# Patient Record
Sex: Male | Born: 1960
Health system: Southern US, Community
[De-identification: ages and names within clinical notes are randomized; demographics above are authoritative.]

## PROBLEM LIST (undated history)

## (undated) DIAGNOSIS — M5136 Other intervertebral disc degeneration, lumbar region: Secondary | ICD-10-CM

## (undated) DIAGNOSIS — M51369 Other intervertebral disc degeneration, lumbar region without mention of lumbar back pain or lower extremity pain: Secondary | ICD-10-CM

## (undated) DIAGNOSIS — E119 Type 2 diabetes mellitus without complications: Secondary | ICD-10-CM

## (undated) DIAGNOSIS — F419 Anxiety disorder, unspecified: Secondary | ICD-10-CM

## (undated) DIAGNOSIS — I2510 Atherosclerotic heart disease of native coronary artery without angina pectoris: Secondary | ICD-10-CM

## (undated) DIAGNOSIS — M549 Dorsalgia, unspecified: Secondary | ICD-10-CM

## (undated) DIAGNOSIS — E118 Type 2 diabetes mellitus with unspecified complications: Secondary | ICD-10-CM

## (undated) DIAGNOSIS — I213 ST elevation (STEMI) myocardial infarction of unspecified site: Secondary | ICD-10-CM

## (undated) DIAGNOSIS — N200 Calculus of kidney: Secondary | ICD-10-CM

## (undated) DIAGNOSIS — R7303 Prediabetes: Secondary | ICD-10-CM

## (undated) DIAGNOSIS — E7800 Pure hypercholesterolemia, unspecified: Secondary | ICD-10-CM

## (undated) DIAGNOSIS — E785 Hyperlipidemia, unspecified: Secondary | ICD-10-CM

## (undated) DIAGNOSIS — I10 Essential (primary) hypertension: Secondary | ICD-10-CM

## (undated) DIAGNOSIS — Z720 Tobacco use: Secondary | ICD-10-CM

## (undated) DIAGNOSIS — I252 Old myocardial infarction: Secondary | ICD-10-CM

## (undated) DIAGNOSIS — M109 Gout, unspecified: Secondary | ICD-10-CM

## (undated) DIAGNOSIS — M7989 Other specified soft tissue disorders: Secondary | ICD-10-CM

## (undated) HISTORY — DX: Other intervertebral disc degeneration, lumbar region without mention of lumbar back pain or lower extremity pain: M51.369

## (undated) HISTORY — PX: APPENDECTOMY: SHX54

## (undated) HISTORY — DX: Prediabetes: R73.03

## (undated) HISTORY — DX: Dorsalgia, unspecified: M54.9

## (undated) HISTORY — DX: Old myocardial infarction: I25.2

## (undated) HISTORY — DX: Other specified soft tissue disorders: M79.89

## (undated) HISTORY — DX: Other intervertebral disc degeneration, lumbar region: M51.36

## (undated) HISTORY — DX: Anxiety disorder, unspecified: F41.9

## (undated) HISTORY — DX: Calculus of kidney: N20.0

## (undated) HISTORY — DX: Tobacco use: Z72.0

## (undated) HISTORY — DX: Atherosclerotic heart disease of native coronary artery without angina pectoris: I25.10

---
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
## 2001-06-19 ENCOUNTER — Encounter: Payer: Self-pay | Admitting: *Deleted

## 2001-06-19 ENCOUNTER — Emergency Department (HOSPITAL_COMMUNITY): Admission: EM | Admit: 2001-06-19 | Discharge: 2001-06-19 | Payer: Self-pay | Admitting: *Deleted

## 2001-06-20 ENCOUNTER — Encounter: Payer: Self-pay | Admitting: *Deleted

## 2001-06-20 ENCOUNTER — Ambulatory Visit (HOSPITAL_COMMUNITY): Admission: RE | Admit: 2001-06-20 | Discharge: 2001-06-20 | Payer: Self-pay | Admitting: *Deleted

## 2004-02-13 ENCOUNTER — Emergency Department (HOSPITAL_COMMUNITY): Admission: EM | Admit: 2004-02-13 | Discharge: 2004-02-13 | Payer: Self-pay | Admitting: *Deleted

## 2007-06-05 ENCOUNTER — Emergency Department (HOSPITAL_COMMUNITY): Admission: EM | Admit: 2007-06-05 | Discharge: 2007-06-05 | Payer: Self-pay | Admitting: Family Medicine

## 2007-12-24 ENCOUNTER — Emergency Department (HOSPITAL_COMMUNITY): Admission: EM | Admit: 2007-12-24 | Discharge: 2007-12-24 | Payer: Self-pay | Admitting: Family Medicine

## 2007-12-26 ENCOUNTER — Emergency Department (HOSPITAL_COMMUNITY): Admission: EM | Admit: 2007-12-26 | Discharge: 2007-12-26 | Payer: Self-pay | Admitting: Emergency Medicine

## 2009-05-21 ENCOUNTER — Encounter: Admission: RE | Admit: 2009-05-21 | Discharge: 2009-05-21 | Payer: Self-pay | Admitting: Family Medicine

## 2010-05-13 ENCOUNTER — Ambulatory Visit (HOSPITAL_COMMUNITY): Admission: RE | Admit: 2010-05-13 | Discharge: 2010-05-13 | Payer: Self-pay | Admitting: Neurosurgery

## 2010-05-14 ENCOUNTER — Emergency Department (HOSPITAL_COMMUNITY): Admission: EM | Admit: 2010-05-14 | Discharge: 2010-05-14 | Payer: Self-pay | Admitting: Family Medicine

## 2011-01-21 ENCOUNTER — Emergency Department (HOSPITAL_COMMUNITY): Payer: 59

## 2011-01-21 ENCOUNTER — Emergency Department (HOSPITAL_COMMUNITY)
Admission: EM | Admit: 2011-01-21 | Discharge: 2011-01-21 | Disposition: A | Discharge status: Home / Self Care | Payer: 59 | Attending: Emergency Medicine | Admitting: Emergency Medicine

## 2011-01-21 DIAGNOSIS — R110 Nausea: Secondary | ICD-10-CM | POA: Insufficient documentation

## 2011-01-21 DIAGNOSIS — N200 Calculus of kidney: Secondary | ICD-10-CM | POA: Insufficient documentation

## 2011-01-21 DIAGNOSIS — I10 Essential (primary) hypertension: Secondary | ICD-10-CM | POA: Insufficient documentation

## 2011-01-21 DIAGNOSIS — R1032 Left lower quadrant pain: Secondary | ICD-10-CM | POA: Insufficient documentation

## 2011-01-21 LAB — CBC
Hemoglobin: 15 g/dL (ref 13.0–17.0)
MCH: 27.4 pg (ref 26.0–34.0)
MCV: 82.4 fL (ref 78.0–100.0)
RBC: 5.47 MIL/uL (ref 4.22–5.81)

## 2011-01-21 LAB — COMPREHENSIVE METABOLIC PANEL
ALT: 40 U/L (ref 0–53)
CO2: 29 mEq/L (ref 19–32)
Calcium: 10.4 mg/dL (ref 8.4–10.5)
GFR calc Af Amer: >60 mL/min (ref >60)
GFR calc non Af Amer: >60 mL/min (ref >60)
Glucose, Bld: 187 mg/dL — ABNORMAL HIGH (ref 70–99)
Sodium: 136 mEq/L (ref 135–145)
Total Bilirubin: 0.1 mg/dL — ABNORMAL LOW (ref 0.3–1.2)

## 2011-01-21 LAB — DIFFERENTIAL
Eosinophils Relative: 2 % (ref 0–5)
Lymphs Abs: 2 10*3/uL (ref 0.7–4.0)
Monocytes Relative: 7 % (ref 3–12)
Neutro Abs: 8.5 10*3/uL — ABNORMAL HIGH (ref 1.7–7.7)

## 2011-01-21 LAB — URINE MICROSCOPIC-ADD ON

## 2011-01-21 LAB — URINALYSIS, ROUTINE W REFLEX MICROSCOPIC
Bilirubin Urine: NEGATIVE
Ketones, ur: NEGATIVE mg/dL
Protein, ur: NEGATIVE mg/dL
Urobilinogen, UA: 0.2 mg/dL (ref 0.0–1.0)

## 2011-01-21 MED ORDER — IOHEXOL 300 MG/ML  SOLN
100.0000 mL | Freq: Once | INTRAMUSCULAR | Status: AC | PRN
  Administered 2011-01-21: 100 mL via INTRAVENOUS

## 2011-04-22 ENCOUNTER — Inpatient Hospital Stay (INDEPENDENT_AMBULATORY_CARE_PROVIDER_SITE_OTHER)
Admission: RE | Admit: 2011-04-22 | Discharge: 2011-04-22 | Disposition: A | Discharge status: Home / Self Care | Payer: 59 | Source: Ambulatory Visit | Attending: Family Medicine | Admitting: Family Medicine

## 2011-04-22 ENCOUNTER — Ambulatory Visit (INDEPENDENT_AMBULATORY_CARE_PROVIDER_SITE_OTHER): Payer: 59

## 2011-04-22 DIAGNOSIS — M109 Gout, unspecified: Secondary | ICD-10-CM

## 2011-04-22 LAB — URIC ACID: Uric Acid, Serum: 7.7 mg/dL (ref 4.0–7.8)

## 2014-01-30 ENCOUNTER — Encounter: Payer: Self-pay | Admitting: Internal Medicine

## 2014-04-08 ENCOUNTER — Encounter: Payer: 59 | Admitting: Internal Medicine

## 2014-07-16 ENCOUNTER — Emergency Department (HOSPITAL_COMMUNITY): Payer: 59

## 2014-07-16 ENCOUNTER — Observation Stay (HOSPITAL_COMMUNITY)
Admission: EM | Admit: 2014-07-16 | Discharge: 2014-07-17 | Disposition: A | Discharge status: Home / Self Care | Payer: 59 | Attending: Internal Medicine | Admitting: Internal Medicine

## 2014-07-16 ENCOUNTER — Encounter (HOSPITAL_COMMUNITY): Payer: Self-pay

## 2014-07-16 DIAGNOSIS — Z9049 Acquired absence of other specified parts of digestive tract: Secondary | ICD-10-CM | POA: Insufficient documentation

## 2014-07-16 DIAGNOSIS — R0602 Shortness of breath: Secondary | ICD-10-CM

## 2014-07-16 DIAGNOSIS — M79662 Pain in left lower leg: Secondary | ICD-10-CM

## 2014-07-16 DIAGNOSIS — E780 Pure hypercholesterolemia: Secondary | ICD-10-CM | POA: Diagnosis not present

## 2014-07-16 DIAGNOSIS — I10 Essential (primary) hypertension: Secondary | ICD-10-CM | POA: Diagnosis not present

## 2014-07-16 DIAGNOSIS — M7989 Other specified soft tissue disorders: Secondary | ICD-10-CM | POA: Insufficient documentation

## 2014-07-16 DIAGNOSIS — R9389 Abnormal findings on diagnostic imaging of other specified body structures: Secondary | ICD-10-CM | POA: Diagnosis present

## 2014-07-16 DIAGNOSIS — E1159 Type 2 diabetes mellitus with other circulatory complications: Secondary | ICD-10-CM | POA: Diagnosis present

## 2014-07-16 DIAGNOSIS — Z79899 Other long term (current) drug therapy: Secondary | ICD-10-CM | POA: Diagnosis not present

## 2014-07-16 DIAGNOSIS — R42 Dizziness and giddiness: Secondary | ICD-10-CM | POA: Insufficient documentation

## 2014-07-16 DIAGNOSIS — I152 Hypertension secondary to endocrine disorders: Secondary | ICD-10-CM | POA: Diagnosis present

## 2014-07-16 DIAGNOSIS — R079 Chest pain, unspecified: Principal | ICD-10-CM | POA: Diagnosis present

## 2014-07-16 DIAGNOSIS — Z720 Tobacco use: Secondary | ICD-10-CM | POA: Diagnosis not present

## 2014-07-16 DIAGNOSIS — R072 Precordial pain: Secondary | ICD-10-CM

## 2014-07-16 DIAGNOSIS — M109 Gout, unspecified: Secondary | ICD-10-CM | POA: Diagnosis not present

## 2014-07-16 DIAGNOSIS — R109 Unspecified abdominal pain: Secondary | ICD-10-CM | POA: Insufficient documentation

## 2014-07-16 DIAGNOSIS — F411 Generalized anxiety disorder: Secondary | ICD-10-CM | POA: Diagnosis present

## 2014-07-16 DIAGNOSIS — E785 Hyperlipidemia, unspecified: Secondary | ICD-10-CM | POA: Diagnosis present

## 2014-07-16 DIAGNOSIS — M79602 Pain in left arm: Secondary | ICD-10-CM

## 2014-07-16 HISTORY — DX: Pure hypercholesterolemia, unspecified: E78.00

## 2014-07-16 HISTORY — DX: Essential (primary) hypertension: I10

## 2014-07-16 HISTORY — DX: Gout, unspecified: M10.9

## 2014-07-16 LAB — COMPREHENSIVE METABOLIC PANEL
ALK PHOS: 69 U/L (ref 39–117)
ALT: 34 U/L (ref 0–53)
AST: 41 U/L — ABNORMAL HIGH (ref 0–37)
Albumin: 4 g/dL (ref 3.5–5.2)
Anion gap: 13 (ref 5–15)
BILIRUBIN TOTAL: 1.1 mg/dL (ref 0.3–1.2)
BUN: 14 mg/dL (ref 6–23)
CALCIUM: 9.5 mg/dL (ref 8.4–10.5)
CO2: 20 mmol/L (ref 19–32)
CREATININE: 1 mg/dL (ref 0.50–1.35)
Chloride: 103 mmol/L (ref 96–112)
GFR calc non Af Amer: 85 mL/min — ABNORMAL LOW (ref >90)
Glucose, Bld: 119 mg/dL — ABNORMAL HIGH (ref 70–99)
POTASSIUM: 4.4 mmol/L (ref 3.5–5.1)
SODIUM: 136 mmol/L (ref 135–145)
TOTAL PROTEIN: 6.9 g/dL (ref 6.0–8.3)

## 2014-07-16 LAB — CBC WITH DIFFERENTIAL/PLATELET
BASOS ABS: 0 10*3/uL (ref 0.0–0.1)
Basophils Relative: 0 % (ref 0–1)
EOS PCT: 1 % (ref 0–5)
Eosinophils Absolute: 0.1 10*3/uL (ref 0.0–0.7)
HCT: 44.6 % (ref 39.0–52.0)
HEMOGLOBIN: 14.9 g/dL (ref 13.0–17.0)
LYMPHS ABS: 2.6 10*3/uL (ref 0.7–4.0)
LYMPHS PCT: 31 % (ref 12–46)
MCH: 27 pg (ref 26.0–34.0)
MCHC: 33.4 g/dL (ref 30.0–36.0)
MCV: 80.8 fL (ref 78.0–100.0)
MONOS PCT: 8 % (ref 3–12)
Monocytes Absolute: 0.6 10*3/uL (ref 0.1–1.0)
NEUTROS PCT: 60 % (ref 43–77)
Neutro Abs: 5.1 10*3/uL (ref 1.7–7.7)
Platelets: 170 10*3/uL (ref 150–400)
RBC: 5.52 MIL/uL (ref 4.22–5.81)
RDW: 14.8 % (ref 11.5–15.5)
WBC: 8.4 10*3/uL (ref 4.0–10.5)

## 2014-07-16 LAB — TSH: TSH: 1.858 u[IU]/mL (ref 0.350–4.500)

## 2014-07-16 LAB — LIPASE, BLOOD: LIPASE: 28 U/L (ref 11–59)

## 2014-07-16 LAB — I-STAT TROPONIN, ED: Troponin i, poc: 0 ng/mL (ref 0.00–0.08)

## 2014-07-16 LAB — TROPONIN I: Troponin I: <0.03 ng/mL (ref <0.031)

## 2014-07-16 LAB — D-DIMER, QUANTITATIVE (NOT AT ARMC): D DIMER QUANT: 0.54 ug{FEU}/mL — AB (ref 0.00–0.48)

## 2014-07-16 MED ORDER — NICOTINE 21 MG/24HR TD PT24
21.0000 mg | MEDICATED_PATCH | Freq: Every day | TRANSDERMAL | Status: DC
  Administered 2014-07-16 – 2014-07-17 (×2): 21 mg via TRANSDERMAL
  Filled 2014-07-16 (×2): qty 1

## 2014-07-16 MED ORDER — CLONAZEPAM 1 MG PO TABS: 1.0000 mg | ORAL_TABLET | Freq: Three times a day (TID) | ORAL | Status: DC | PRN

## 2014-07-16 MED ORDER — ACETAMINOPHEN 650 MG RE SUPP: 650.0000 mg | Freq: Four times a day (QID) | RECTAL | Status: DC | PRN

## 2014-07-16 MED ORDER — LISINOPRIL-HYDROCHLOROTHIAZIDE 20-12.5 MG PO TABS: 1.0000 | ORAL_TABLET | Freq: Every day | ORAL | Status: DC

## 2014-07-16 MED ORDER — ENOXAPARIN SODIUM 40 MG/0.4ML ~~LOC~~ SOLN
40.0000 mg | SUBCUTANEOUS | Status: DC
  Administered 2014-07-16: 40 mg via SUBCUTANEOUS
  Filled 2014-07-16: qty 0.4

## 2014-07-16 MED ORDER — HYDROCHLOROTHIAZIDE 12.5 MG PO CAPS
12.5000 mg | ORAL_CAPSULE | Freq: Every day | ORAL | Status: DC
  Administered 2014-07-17: 12.5 mg via ORAL
  Filled 2014-07-16: qty 1

## 2014-07-16 MED ORDER — LISINOPRIL 20 MG PO TABS
20.0000 mg | ORAL_TABLET | Freq: Every day | ORAL | Status: DC
  Administered 2014-07-17: 20 mg via ORAL
  Filled 2014-07-16: qty 1

## 2014-07-16 MED ORDER — MORPHINE SULFATE 2 MG/ML IJ SOLN: 1.0000 mg | INTRAMUSCULAR | Status: DC | PRN

## 2014-07-16 MED ORDER — CLONAZEPAM 0.5 MG PO TABS
0.5000 mg | ORAL_TABLET | Freq: Two times a day (BID) | ORAL | Status: DC | PRN
  Administered 2014-07-16: 1 mg via ORAL
  Filled 2014-07-16: qty 2

## 2014-07-16 MED ORDER — ONDANSETRON HCL 4 MG/2ML IJ SOLN
4.0000 mg | Freq: Once | INTRAMUSCULAR | Status: AC
  Administered 2014-07-16: 4 mg via INTRAVENOUS
  Filled 2014-07-16: qty 2

## 2014-07-16 MED ORDER — ALUM & MAG HYDROXIDE-SIMETH 200-200-20 MG/5ML PO SUSP: 30.0000 mL | Freq: Four times a day (QID) | ORAL | Status: DC | PRN

## 2014-07-16 MED ORDER — SENNOSIDES-DOCUSATE SODIUM 8.6-50 MG PO TABS: 1.0000 | ORAL_TABLET | Freq: Every evening | ORAL | Status: DC | PRN

## 2014-07-16 MED ORDER — ONDANSETRON HCL 4 MG PO TABS: 4.0000 mg | ORAL_TABLET | Freq: Four times a day (QID) | ORAL | Status: DC | PRN

## 2014-07-16 MED ORDER — ONDANSETRON HCL 4 MG/2ML IJ SOLN: 4.0000 mg | Freq: Four times a day (QID) | INTRAMUSCULAR | Status: DC | PRN

## 2014-07-16 MED ORDER — MORPHINE SULFATE 4 MG/ML IJ SOLN
4.0000 mg | Freq: Once | INTRAMUSCULAR | Status: AC
  Administered 2014-07-16: 4 mg via INTRAVENOUS
  Filled 2014-07-16: qty 1

## 2014-07-16 MED ORDER — NITROGLYCERIN 0.4 MG SL SUBL
0.4000 mg | SUBLINGUAL_TABLET | SUBLINGUAL | Status: DC | PRN
  Administered 2014-07-16: 0.4 mg via SUBLINGUAL
  Filled 2014-07-16: qty 1

## 2014-07-16 MED ORDER — IOHEXOL 350 MG/ML SOLN
80.0000 mL | Freq: Once | INTRAVENOUS | Status: AC | PRN
  Administered 2014-07-16: 80 mL via INTRAVENOUS

## 2014-07-16 MED ORDER — ACETAMINOPHEN 325 MG PO TABS: 650.0000 mg | ORAL_TABLET | Freq: Four times a day (QID) | ORAL | Status: DC | PRN

## 2014-07-16 MED ORDER — SODIUM CHLORIDE 0.9 % IJ SOLN
3.0000 mL | Freq: Two times a day (BID) | INTRAMUSCULAR | Status: DC
  Administered 2014-07-16 – 2014-07-17 (×2): 3 mL via INTRAVENOUS

## 2014-07-16 NOTE — ED Notes (Signed)
Pt states the pain is intermittent

## 2014-07-16 NOTE — ED Notes (Signed)
Wife works here as Scientist, clinical (histocompatibility and immunogenetics)[employer] and contacted to make aware her husband is down here.

## 2014-07-16 NOTE — ED Notes (Addendum)
Doppler at the bedside for duplex, wife at the bedside.

## 2014-07-16 NOTE — H&P (Signed)
[facility] History and Physical  Timothy GarinDavid A May ZOX:[id number] DOB: 05/01/1961 DOA: 07/16/2014  Referring physician: EDP PCP: Joycelyn RuaMEYERS, STEPHEN, MD   Chief Complaint: chest pain  HPI: Timothy May is a 54 y.o. male  With h/o tobacco abuse, HTN, HLD, anxiety presents with intermittent left chest pain which radiates to left arm. Today, became more severe, so EMS was summoned. Patient receive aspirin, nitroglycerin which lessened the pain, then morphine which resolved the pain. Has been under stress, and usually feels anxious during these episodes. No cough, f/c. Not associated with exertion, eating. Worsening DOE over the past 6 months. Father had MI in his 4860s.     Review of Systems:  As per HPI. Complete systems review otherwise negative.  Past Medical History  Diagnosis Date  . Hypertension   . Hypercholesteremia    Past Surgical History  Procedure Laterality Date  . Appendectomy     Social History: smokes 1ppd. Drinks rarely. No drugs  No Known Allergies  FH: father with CAD   Prior to Admission medications   Medication Sig Start Date End Date Taking? Authorizing Provider  clonazePAM (KLONOPIN) 0.5 MG tablet Take 0.5-1 mg by mouth daily.   Yes Historical Provider, MD  lisinopril-hydrochlorothiazide (PRINZIDE,ZESTORETIC) 20-12.5 MG per tablet Take 1 tablet by mouth daily.   Yes Historical Provider, MD  metoprolol succinate (TOPROL-XL) 25 MG 24 hr tablet Take 12.5 mg by mouth daily.    Yes Historical Provider, MD   Physical Exam: Filed Vitals:   07/16/14 1515 07/16/14 1600 07/16/14 1645 07/16/14 1722  BP: 136/67 151/95 138/83 147/82  Pulse: 64 68 57   Temp:    97.6 F (36.4 C)  TempSrc:    Oral  Resp: 17 20 13    Height:    6' (1.829 m)  Weight:    109.181 kg (240 lb 11.2 oz)  SpO2: 99% 99% 97% 95%    Wt Readings from Last 3 Encounters:  07/16/14 109.181 kg (240 lb 11.2 oz)  BP 122/68 mmHg  Pulse 58  Temp(Src) 98.1 F (36.7 C) (Oral)  Resp 16  Ht  6' (1.829 m)  Wt 109.181 kg (240 lb 11.2 oz)  BMI 32.64 kg/m2  SpO2 96%  General Appearance:    Alert, cooperative, no distress, appears stated age  Head:    Normocephalic, without obvious abnormality, atraumatic  Eyes:    PERRL, conjunctiva/corneas clear, EOM's intact,    Nose:   Nares normal, septum midline, mucosa normal, no drainage   or sinus tenderness  Throat:   Lips, mucosa, and tongue normal; teeth and gums normal  Neck:   Supple, symmetrical, trachea midline, no adenopathy;       thyroid:  No enlargement/tenderness/nodules; no carotid   bruit or JVD  Back:     Symmetric, no curvature, ROM normal, no CVA tenderness  Lungs:     Clear to auscultation bilaterally, respirations unlabored  Chest wall:    No tenderness or deformity  Heart:    Regular rate and rhythm, S1 and S2 normal, no murmur, rub   or gallop  Abdomen:     Soft, non-tender, bowel sounds active  Genitalia:    deferred  Rectal:    deferred  Extremities:   Extremities normal, atraumatic, no cyanosis or edema  Pulses:   2+ and symmetric all extremities  Skin:   Skin color, texture, turgor normal, no rashes or lesions  Lymph nodes:   Cervical, supraclavicular, and axillary nodes normal  Neurologic:   CNII-XII  intact. Normal strength, sensation and reflexes      throughout    Psych: calm cooperative normal affect        Labs on Admission:  Basic Metabolic Panel:  Recent Labs Lab 07/16/14 1224  NA 136  K 4.4  CL 103  CO2 20  GLUCOSE 119*  BUN 14  CREATININE 1.00  CALCIUM 9.5   Liver Function Tests:  Recent Labs Lab 07/16/14 1224  AST 41*  ALT 34  ALKPHOS 69  BILITOT 1.1  PROT 6.9  ALBUMIN 4.0    Recent Labs Lab 07/16/14 1224  LIPASE 28   No results for input(s): AMMONIA in the last 168 hours. CBC:  Recent Labs Lab 07/16/14 1224  WBC 8.4  NEUTROABS 5.1  HGB 14.9  HCT 44.6  MCV 80.8  PLT 170   Cardiac Enzymes: No results for input(s): CKTOTAL, CKMB, CKMBINDEX, TROPONINI in  the last 168 hours.  BNP (last 3 results) No results for input(s): PROBNP in the last 8760 hours. CBG: No results for input(s): GLUCAP in the last 168 hours.  Radiological Exams on Admission: Ct Angio Chest Pe W/cm &/or Wo Cm  07/16/2014   CLINICAL DATA:  Acute chest pain and shortness of breath.  EXAM: CT ANGIOGRAPHY CHEST WITH CONTRAST  TECHNIQUE: Multidetector CT imaging of the chest was performed using the standard protocol during bolus administration of intravenous contrast. Multiplanar CT image reconstructions and MIPs were obtained to evaluate the vascular anatomy.  CONTRAST:  80mL OMNIPAQUE IOHEXOL 350 MG/ML SOLN  COMPARISON:  Chest radiograph of same day. CT scan of January 21, 2011.  FINDINGS: No pneumothorax or pleural effusion is noted. 6 mm subpleural nodule is noted laterally in the left lower lobe. This is new since prior exam 4 mm nodule is noted adjacent to major fissure laterally in right lower lobe which is unchanged compared to prior exam. 6 mm nodule is noted inferiorly in the right lower lobe best seen on image number 49 of series 2.  There is no evidence of pulmonary embolus. There is no evidence of thoracic aortic dissection or aneurysm. 17 x 14 mm left hilar lymph node is noted. 14 x 13 mm right sub carinal lymph node is noted. Within the visualized portion of the abdomen, porta hepatis adenopathy is noted which is not significantly changed compared to prior CT scan.  Review of the MIP images confirms the above findings.  IMPRESSION: No evidence of pulmonary embolus.  Left hilar and right subcarinal lymph nodes are noted as described above. It is uncertain if these are inflammatory or metastatic in etiology. Bilateral pulmonary nodules are noted, at least 2 of which are not visualized on prior CT scan. The largest of these measures 6 mm. If the patient is at high risk for bronchogenic carcinoma, follow-up chest CT at 6-12 months is recommended. If the patient is at low risk for  bronchogenic carcinoma, follow-up chest CT at 12 months is recommended. This recommendation follows the consensus statement: Guidelines for Management of Small Pulmonary Nodules Detected on CT Scans: A Statement from the Fleischner Society as published in Radiology 2005;237:395-400.   Electronically Signed   By: Roque Lias M.D.   On: 07/16/2014 15:34   Dg Chest Port 1 View  07/16/2014   CLINICAL DATA:  Left-sided chest pain.  EXAM: PORTABLE CHEST - 1 VIEW  COMPARISON:  None.  FINDINGS: There are low lung volumes with crowding of the interstitial markings. There is no focal parenchymal opacity, pleural effusion, or  pneumothorax. The heart and mediastinal contours are unremarkable.  The osseous structures are unremarkable.  IMPRESSION: No active disease.   Electronically Signed   By: Elige Ko   On: 07/16/2014 13:17    [id number] reviewed. NSR  Assessment/Plan  Principal Problem:   Chest pain, atypical, but multiple risk factors and no previous cardiac workup. Continue ASA. Observation, tele, r/o MI. NPO after midnight for stress test. Active Problems:   Generalized anxiety disorder: prn clonazepam   Essential hypertension: continue prinizide. Hold metoprolol for stress test   Hyperlipidemia on statin   Tobacco abuse, counseled against. nicoderm Abnormal CT chest with lymphadenopathy and subcentimeter nodules: repeat CT chest 6-12 months  Ryszard Socarras L [facility]

## 2014-07-16 NOTE — ED Notes (Signed)
Dr. Plunkett at the bedside.  

## 2014-07-16 NOTE — ED Notes (Addendum)
Pain in [facility], today he is having pain left arm chest, and is feeling sob.  Visually distraught.  12 lead unremarkable per EMS. Given 324 asa, 1 ntg, some improvement.  Currently nauseated and dizzy, redness to face and chest.  C/o dizziness.  Wife is anesthesiologist, thinks patient might be having blood clot.

## 2014-07-16 NOTE — ED Provider Notes (Signed)
CSN: [id number]     Arrival date & time 07/16/14  1208 History   First MD Initiated Contact with Patient 07/16/14 1212     Chief Complaint  Patient presents with  . Chest Pain     (Consider location/radiation/quality/duration/timing/severity/associated sxs/prior Treatment) Patient is a 54 y.o. male presenting with chest pain. The history is provided by the patient.  Chest Pain Pain location:  L chest Pain quality: aching and burning   Pain radiates to:  L shoulder Pain radiates to the back: no   Pain severity:  Moderate Onset quality:  Gradual Duration:  2 weeks (this episode started 1 hour ago) Progression:  Unchanged Chronicity:  New Context comment:  Patient states for the last 2 weeks he's had intermittent pain that he cannot relate with eating, exertion or position however in the last 1 week he is also noted swelling and pain in his left lower leg Relieved by: States the pain improves slightly with nitroglycerin given by EMS. Worsened by:  Nothing tried Ineffective treatments:  None tried Associated symptoms: dizziness, lower extremity edema and shortness of breath   Associated symptoms: no abdominal pain, no anorexia, no cough, no diaphoresis, no nausea and not vomiting   Risk factors: high cholesterol, hypertension, male sex and smoking   Risk factors: no diabetes mellitus, no immobilization, no prior DVT/PE and no surgery     Past Medical History  Diagnosis Date  . Hypertension   . Hypercholesteremia    Past Surgical History  Procedure Laterality Date  . Appendectomy     History reviewed. No pertinent family history. History  Substance Use Topics  . Smoking status: Current Every Day Smoker  . Smokeless tobacco: Not on file  . Alcohol Use: Yes    Review of Systems  Constitutional: Negative for diaphoresis.  Respiratory: Positive for shortness of breath. Negative for cough.   Cardiovascular: Positive for chest pain.  Gastrointestinal: Negative for nausea,  vomiting, abdominal pain and anorexia.  Neurological: Positive for dizziness.  All other systems reviewed and are negative.     Allergies  Review of patient's allergies indicates no known allergies.  Home Medications   Prior to Admission medications   Not on File   BP 138/72 mmHg  Pulse 82  Temp(Src) 97.9 F (36.6 C) (Oral)  Resp 19  SpO2 97% Physical Exam  Constitutional: He is oriented to person, place, and time. He appears well-developed and well-nourished. He appears distressed.  Appears uncomfortable  HENT:  Head: Normocephalic and atraumatic.  Mouth/Throat: Oropharynx is clear and moist.  Eyes: Conjunctivae and EOM are normal. Pupils are equal, round, and reactive to light.  Neck: Normal range of motion. Neck supple.  Cardiovascular: Normal rate, regular rhythm and intact distal pulses.   No murmur heard. Pulmonary/Chest: Effort normal and breath sounds normal. No respiratory distress. He has no wheezes. He has no rales.  Abdominal: Soft. He exhibits no distension. There is tenderness. There is no rebound and no guarding.  Musculoskeletal: Normal range of motion. He exhibits tenderness. He exhibits no edema.  Tenderness and nonpitting edema in the left lower extremity. Tenderness is over the medial thigh on the left and the ankle  Neurological: He is alert and oriented to person, place, and time.  Skin: Skin is warm and dry. No rash noted. No erythema.  Psychiatric: He has a normal mood and affect. His behavior is normal.  Nursing note and vitals reviewed.   ED Course  Procedures (including critical care time) Labs Review Labs  Reviewed  COMPREHENSIVE METABOLIC PANEL - Abnormal; Notable for the following:    Glucose, Bld 119 (*)    AST 41 (*)    GFR calc non Af Amer 85 (*)    All other components within normal limits  D-DIMER, QUANTITATIVE - Abnormal; Notable for the following:    D-Dimer, Quant 0.54 (*)    All other components within normal limits  CBC WITH  DIFFERENTIAL/PLATELET  LIPASE, BLOOD  I-STAT TROPOININ, ED    Imaging Review Ct Angio Chest Pe W/cm &/or Wo Cm  07/16/2014   CLINICAL DATA:  Acute chest pain and shortness of breath.  EXAM: CT ANGIOGRAPHY CHEST WITH CONTRAST  TECHNIQUE: Multidetector CT imaging of the chest was performed using the standard protocol during bolus administration of intravenous contrast. Multiplanar CT image reconstructions and MIPs were obtained to evaluate the vascular anatomy.  CONTRAST:  80mL OMNIPAQUE IOHEXOL 350 MG/ML SOLN  COMPARISON:  Chest radiograph of same day. CT scan of January 21, 2011.  FINDINGS: No pneumothorax or pleural effusion is noted. 6 mm subpleural nodule is noted laterally in the left lower lobe. This is new since prior exam 4 mm nodule is noted adjacent to major fissure laterally in right lower lobe which is unchanged compared to prior exam. 6 mm nodule is noted inferiorly in the right lower lobe best seen on image number 49 of series 2.  There is no evidence of pulmonary embolus. There is no evidence of thoracic aortic dissection or aneurysm. 17 x 14 mm left hilar lymph node is noted. 14 x 13 mm right sub carinal lymph node is noted. Within the visualized portion of the abdomen, porta hepatis adenopathy is noted which is not significantly changed compared to prior CT scan.  Review of the MIP images confirms the above findings.  IMPRESSION: No evidence of pulmonary embolus.  Left hilar and right subcarinal lymph nodes are noted as described above. It is uncertain if these are inflammatory or metastatic in etiology. Bilateral pulmonary nodules are noted, at least 2 of which are not visualized on prior CT scan. The largest of these measures 6 mm. If the patient is at high risk for bronchogenic carcinoma, follow-up chest CT at 6-12 months is recommended. If the patient is at low risk for bronchogenic carcinoma, follow-up chest CT at 12 months is recommended. This recommendation follows the consensus  statement: Guidelines for Management of Small Pulmonary Nodules Detected on CT Scans: A Statement from the Fleischner Society as published in Radiology 2005;237:395-400.   Electronically Signed   By: Roque Lias M.D.   On: 07/16/2014 15:34   Dg Chest Port 1 View  07/16/2014   CLINICAL DATA:  Left-sided chest pain.  EXAM: PORTABLE CHEST - 1 VIEW  COMPARISON:  None.  FINDINGS: There are low lung volumes with crowding of the interstitial markings. There is no focal parenchymal opacity, pleural effusion, or pneumothorax. The heart and mediastinal contours are unremarkable.  The osseous structures are unremarkable.  IMPRESSION: No active disease.   Electronically Signed   By: Elige Ko   On: 07/16/2014 13:17     EKG Interpretation   Date/Time:  Tuesday July 16 2014 12:09:33 EST Ventricular Rate:  86 PR Interval:  146 QRS Duration: 86 QT Interval:  384 QTC Calculation: 459 R Axis:   81 Text Interpretation:  Sinus rhythm No previous tracing Confirmed by  Anitra Lauth  MD, Alphonzo Lemmings ([id number]) on 07/16/2014 12:37:56 PM      MDM   Final diagnoses:  Chest  pain    Patient presents with a complaint of left-sided chest pain as well as going into the upper arm that has been intermittent for the last 2 weeks but worse for the last 1 hour. There is associated shortness of breath. Also patient notes that for the last week he's had left lower extremity swelling without PE risk factors such as prolonged immobilization, surgery or trauma.  Patient does have a significant history of hypertension, hyperlipidemia, tobacco abuse as well as a father who had cardiac issues in his 5760s. He has only minimal epigastric tenderness on exam   Patient has no prior cardiac history. EKG today is within normal limits.  Concern for PE versus ACS versus possible GI pathology.  CBC, CMP, lipase, troponin, chest x-ray, lower extremity Doppler pending.  4:06 PM Lower extremity duplex is negative. CT of the chest showed a  pulmonary nodule with lymphadenopathy but no sign of PE given his positive d-dimer. Initial troponin was negative and EKG was normal. However given patient's symptoms of chest pain that's been intermittent for the last 2 weeks and his risk factors feel reasonable that he be admitted for rule out. After nitroglycerin and morphine patient's pain is resolved.  Gwyneth SproutWhitney Mitchelle Sultan, MD 07/16/14 [phone]

## 2014-07-16 NOTE — ED Notes (Signed)
EDMD at bedside

## 2014-07-16 NOTE — ED Notes (Signed)
Portable chest x ray completed.

## 2014-07-17 ENCOUNTER — Observation Stay (HOSPITAL_COMMUNITY): Payer: 59

## 2014-07-17 ENCOUNTER — Encounter (HOSPITAL_COMMUNITY): Payer: Self-pay | Admitting: Cardiology

## 2014-07-17 ENCOUNTER — Other Ambulatory Visit (HOSPITAL_COMMUNITY): Payer: 59

## 2014-07-17 DIAGNOSIS — R0789 Other chest pain: Secondary | ICD-10-CM

## 2014-07-17 DIAGNOSIS — R072 Precordial pain: Secondary | ICD-10-CM

## 2014-07-17 DIAGNOSIS — R938 Abnormal findings on diagnostic imaging of other specified body structures: Secondary | ICD-10-CM

## 2014-07-17 DIAGNOSIS — R079 Chest pain, unspecified: Secondary | ICD-10-CM

## 2014-07-17 DIAGNOSIS — E785 Hyperlipidemia, unspecified: Secondary | ICD-10-CM

## 2014-07-17 DIAGNOSIS — Z720 Tobacco use: Secondary | ICD-10-CM

## 2014-07-17 LAB — LACTATE DEHYDROGENASE: LDH: 151 U/L (ref 94–250)

## 2014-07-17 LAB — INFLUENZA PANEL BY PCR (TYPE A & B)
H1N1FLUPCR: NOT DETECTED
Influenza A By PCR: NEGATIVE
Influenza B By PCR: NEGATIVE

## 2014-07-17 LAB — SEDIMENTATION RATE: SED RATE: 7 mm/h (ref 0–16)

## 2014-07-17 LAB — TROPONIN I: Troponin I: <0.03 ng/mL (ref <0.031)

## 2014-07-17 MED ORDER — TECHNETIUM TC 99M SESTAMIBI GENERIC - CARDIOLITE
30.0000 | Freq: Once | INTRAVENOUS | Status: AC | PRN
  Administered 2014-07-17: 30 via INTRAVENOUS

## 2014-07-17 MED ORDER — ATORVASTATIN CALCIUM 40 MG PO TABS
40.0000 mg | ORAL_TABLET | Freq: Every day | ORAL | Status: DC
  Administered 2014-07-17: 40 mg via ORAL
  Filled 2014-07-17: qty 1

## 2014-07-17 MED ORDER — TECHNETIUM TC 99M SESTAMIBI GENERIC - CARDIOLITE
10.0000 | Freq: Once | INTRAVENOUS | Status: AC | PRN
Start: 2014-07-17 — End: 2014-07-17
  Administered 2014-07-17: 10 via INTRAVENOUS

## 2014-07-17 NOTE — Discharge Summary (Signed)
Physician Discharge Summary  Timothy May BJY:[id number] DOB: 1961-02-12 DOA: 07/16/2014  PCP: Joycelyn Rua, MD  Admit date: 07/16/2014 Discharge date: 07/17/2014  Recommendations for f/u Repeat CT in 3 months f/u nodules and lymphadenopathy  Discharge Diagnoses:  Principal Problem:   Chest pain Active Problems:   Generalized anxiety disorder   Essential hypertension   Hyperlipidemia   Tobacco abuse   Abnormal CT scan, chest  Discharge Condition: stable  Filed Weights   07/16/14 1722 07/17/14 0525  Weight: 109.181 kg (240 lb 11.2 oz) 108.863 kg (240 lb)    History of present illness:  54 y.o. male  With h/o tobacco abuse, HTN, HLD, anxiety presents with intermittent left chest pain which radiates to left arm. Today, became more severe, so EMS was summoned. Patient receive aspirin, nitroglycerin which lessened the pain, then morphine which resolved the pain. Has been under stress, and usually feels anxious during these episodes. No cough, f/c. Not associated with exertion, eating. Worsening DOE over the past 6 months. Father had MI in his 52s.   Hospital Course:  Observed on telemetry. MI ruled out. Cardiology consulted. myoview low risk. Patient reported increase in life stressors. Felt chest pain may be anxiety related. Referred to therapist.  CTA chest showed no PE, but showed subcentemeter nodules and nonspecific lymphadenopathy.  Discussed with oncology. Repeat CT in 3 months.   Procedures:  none  Consultations:  [facility]  Discharge Exam: Filed Vitals:   07/17/14 1302  BP: 129/71  Pulse: 74  Temp: 98.5 F (36.9 C)  Resp:     General: tearful Cardiovascular: RRR Respiratory: CTA   Discharge Instructions   Discharge Instructions    Diet - low sodium heart healthy    Complete by:  As directed      Discharge instructions    Complete by:  As directed   Quit smoking.  Repeat CT chest 3 months     Increase activity slowly    Complete by:  As  directed           Current Discharge Medication List    CONTINUE these medications which have NOT CHANGED   Details  atorvastatin (LIPITOR) 40 MG tablet Take 40 mg by mouth daily.    clonazePAM (KLONOPIN) 0.5 MG tablet Take 0.5-1 mg by mouth 2 (two) times daily as needed for anxiety.     ibuprofen (ADVIL,MOTRIN) 200 MG tablet Take 400-600 mg by mouth every 8 (eight) hours as needed.    lisinopril-hydrochlorothiazide (PRINZIDE,ZESTORETIC) 20-12.5 MG per tablet Take 1 tablet by mouth daily.    metoprolol succinate (TOPROL-XL) 25 MG 24 hr tablet Take 12.5 mg by mouth daily.        No Known Allergies    The results of significant diagnostics from this hospitalization (including imaging, microbiology, ancillary and laboratory) are listed below for reference.    Significant Diagnostic Studies: Ct Angio Chest Pe W/cm &/or Wo Cm  07/16/2014   CLINICAL DATA:  Acute chest pain and shortness of breath.  EXAM: CT ANGIOGRAPHY CHEST WITH CONTRAST  TECHNIQUE: Multidetector CT imaging of the chest was performed using the standard protocol during bolus administration of intravenous contrast. Multiplanar CT image reconstructions and MIPs were obtained to evaluate the vascular anatomy.  CONTRAST:  80mL OMNIPAQUE IOHEXOL 350 MG/ML SOLN  COMPARISON:  Chest radiograph of same day. CT scan of January 21, 2011.  FINDINGS: No pneumothorax or pleural effusion is noted. 6 mm subpleural nodule is noted laterally in the left lower lobe. This  is new since prior exam 4 mm nodule is noted adjacent to major fissure laterally in right lower lobe which is unchanged compared to prior exam. 6 mm nodule is noted inferiorly in the right lower lobe best seen on image number 49 of series 2.  There is no evidence of pulmonary embolus. There is no evidence of thoracic aortic dissection or aneurysm. 17 x 14 mm left hilar lymph node is noted. 14 x 13 mm right sub carinal lymph node is noted. Within the visualized portion of the  abdomen, porta hepatis adenopathy is noted which is not significantly changed compared to prior CT scan.  Review of the MIP images confirms the above findings.  IMPRESSION: No evidence of pulmonary embolus.  Left hilar and right subcarinal lymph nodes are noted as described above. It is uncertain if these are inflammatory or metastatic in etiology. Bilateral pulmonary nodules are noted, at least 2 of which are not visualized on prior CT scan. The largest of these measures 6 mm. If the patient is at high risk for bronchogenic carcinoma, follow-up chest CT at 6-12 months is recommended. If the patient is at low risk for bronchogenic carcinoma, follow-up chest CT at 12 months is recommended. This recommendation follows the consensus statement: Guidelines for Management of Small Pulmonary Nodules Detected on CT Scans: A Statement from the Fleischner Society as published in Radiology 2005;237:395-400.   Electronically Signed   By: Roque Lias M.D.   On: 07/16/2014 15:34   Nm Myocar Multi W/spect W/wall Motion / Ef  07/17/2014   CLINICAL DATA:  54 y.o. male with a h/o tobacco abuse, HTN, HLD and anxiety who presented to ER with complaints of intermittent left chest pain which radiates to left arm  EXAM: MYOCARDIAL IMAGING WITH SPECT (REST AND EXERCISE)  GATED LEFT VENTRICULAR WALL MOTION STUDY  LEFT VENTRICULAR EJECTION FRACTION  TECHNIQUE: Standard myocardial SPECT imaging was performed after resting intravenous injection of 10 mCi Tc-70m sestamibi. Subsequently, exercise tolerance test was performed by the patient under the supervision of the Cardiology staff. At peak-stress, 30 mCi Tc-6m sestamibi was injected intravenously and standard myocardial SPECT imaging was performed. Quantitative gated imaging was also performed to evaluate left ventricular wall motion, and estimate left ventricular ejection fraction.  COMPARISON:  None.  FINDINGS: Perfusion: Mild diaphragmatic attenuation artifact. Otherwise, no  decreased activity in the left ventricle on stress imaging to suggest reversible ischemia or infarction.  Wall Motion: Normal left ventricular wall motion. No left ventricular dilation.  Left Ventricular Ejection Fraction: 70 %  End diastolic volume 85 ml  End systolic volume 25 ml  IMPRESSION: 1. No reversible ischemia or infarction.  2. Normal left ventricular wall motion.  3. Left ventricular ejection fraction %  4. Low-risk stress test findings* with mild diaphragmatic attenuation artifact.  *2012 Appropriate Use Criteria for Coronary Revascularization Focused Update: J Am Coll Cardiol. 2012;59(9):857-881. http://content.dementiazones.com.aspx?articleid=1201161   Electronically Signed   By: Thurmon Fair   On: 07/17/2014 15:16   Dg Chest Port 1 View  07/16/2014   CLINICAL DATA:  Left-sided chest pain.  EXAM: PORTABLE CHEST - 1 VIEW  COMPARISON:  None.  FINDINGS: There are low lung volumes with crowding of the interstitial markings. There is no focal parenchymal opacity, pleural effusion, or pneumothorax. The heart and mediastinal contours are unremarkable.  The osseous structures are unremarkable.  IMPRESSION: No active disease.   Electronically Signed   By: Elige Ko   On: 07/16/2014 13:17   Echo Left ventricle: Systolic  function was normal. The estimated ejection fraction was in the range of 55% to 60%. Wall motion was normal; there were no regional wall motion abnormalities. There was an increased relative contribution of atrial contraction to ventricular filling. Doppler parameters are consistent with abnormal left ventricular relaxation (grade 1 diastolic dysfunction). - Atrial septum: No defect or patent foramen ovale was identified.  EKG NSR  Microbiology: No results found for this or any previous visit (from the past 240 hour(s)).   Labs: Basic Metabolic Panel:  Recent Labs Lab 07/16/14 1224  NA 136  K 4.4  CL 103  CO2 20  GLUCOSE 119*  BUN 14   CREATININE 1.00  CALCIUM 9.5   Liver Function Tests:  Recent Labs Lab 07/16/14 1224  AST 41*  ALT 34  ALKPHOS 69  BILITOT 1.1  PROT 6.9  ALBUMIN 4.0    Recent Labs Lab 07/16/14 1224  LIPASE 28   No results for input(s): AMMONIA in the last 168 hours. CBC:  Recent Labs Lab 07/16/14 1224  WBC 8.4  NEUTROABS 5.1  HGB 14.9  HCT 44.6  MCV 80.8  PLT 170   Cardiac Enzymes:  Recent Labs Lab 07/16/14 2001 07/17/14 0004  TROPONINI <0.03 <0.03   BNP: BNP (last 3 results) No results for input(s): PROBNP in the last 8760 hours. CBG: No results for input(s): GLUCAP in the last 168 hours.     SignedChristiane Ha:  Emme Rosenau L  [facility] 07/17/2014, 5:29 PM

## 2014-07-17 NOTE — Progress Notes (Signed)
Pt discharged home with spouse.  Reviewed discharge instructions and education, all questions answered.  Assessment unchanged from earlier. 

## 2014-07-17 NOTE — Progress Notes (Signed)
UR completed 

## 2014-07-17 NOTE — Consult Note (Addendum)
Admit date: 07/16/2014 Referring Physician  Dr. Lendell Caprice Primary Physician  Dr. Silvestre Moment Primary Cardiologist  None Reason for Consultation  Chest pain  HPI: Timothy May is a 54 y.o. male with a h/o tobacco abuse, HTN, HLD and anxiety who presented to ER with complaints of intermittent left chest pain which radiates to left arm. Yesterday his symptoms were more severe lasting over an hour, so he called EMS. He was given aspirin and nitroglycerin which lessened the pain, then morphine which resolved the pain. He says that he has been under stress, and usually feels anxious during these episodes.  There are no associated symptoms of SOB, diaphoresis or nausea but the pain will radiate down his left arm. He has noticed worsening DOE over the past 6 months. He has been having intermittent episodes of pain for a few weeks now.  They can occur with rest or with exertion.  He has a family history of CAD with her Father having an MI in his 60s. Cardiology is now asked to consult for workup of CP.      PMH:   Past Medical History  Diagnosis Date  . Hypertension   . Hypercholesteremia      PSH:   Past Surgical History  Procedure Laterality Date  . Appendectomy      Allergies:  Review of patient's allergies indicates no known allergies. Prior to Admit Meds:   Prescriptions prior to admission  Medication Sig Dispense Refill Last Dose  . atorvastatin (LIPITOR) 40 MG tablet Take 40 mg by mouth daily.   07/15/2014 at Unknown time  . clonazePAM (KLONOPIN) 0.5 MG tablet Take 0.5-1 mg by mouth 2 (two) times daily as needed for anxiety.    07/15/2014 at Unknown time  . ibuprofen (ADVIL,MOTRIN) 200 MG tablet Take 400-600 mg by mouth every 8 (eight) hours as needed.   07/13/2014  . lisinopril-hydrochlorothiazide (PRINZIDE,ZESTORETIC) 20-12.5 MG per tablet Take 1 tablet by mouth daily.   07/15/2014 at Unknown time  . metoprolol succinate (TOPROL-XL) 25 MG 24 hr tablet Take 12.5 mg by mouth daily.     07/15/2014 at 1200   Fam HX:    Family History  Problem Relation Age of Onset  . Heart attack Father   . Heart disease Father    Social HX:    History   Social History  . Marital Status: Married    Spouse Name: N/A    Number of Children: N/A  . Years of Education: N/A   Occupational History  . Not on file.   Social History Main Topics  . Smoking status: Current Every Day Smoker  . Smokeless tobacco: Not on file  . Alcohol Use: Yes  . Drug Use: Not on file  . Sexual Activity: Not on file   Other Topics Concern  . Not on file   Social History Narrative     ROS:  All 11 ROS were addressed and are negative except what is stated in the HPI  Physical Exam: Blood pressure 135/78, pulse 63, temperature 98.1 F (36.7 C), temperature source Oral, resp. rate 16, height 6' (1.829 m), weight 240 lb (108.863 kg), SpO2 97 %.    General: Well developed, well nourished, in no acute distress Head: Eyes PERRLA, No xanthomas.   Normal cephalic and atramatic  Lungs:   Clear bilaterally to auscultation and percussion. Heart:   HRRR S1 S2 Pulses are 2+ & equal.  No carotid bruit. No JVD.  No abdominal bruits. No femoral bruits. Abdomen: Bowel sounds are positive, abdomen soft and non-tender without masses  Extremities:   No clubbing, cyanosis or edema.  DP +1 Neuro: Alert and oriented X 3. Psych:  Good affect, responds appropriately    Labs:   Lab Results  Component Value Date   WBC 8.4 07/16/2014   HGB 14.9 07/16/2014   HCT 44.6 07/16/2014   MCV 80.8 07/16/2014   PLT 170 07/16/2014     Recent Labs Lab 07/16/14 1224  NA 136  K 4.4  CL 103  CO2 20  BUN 14  CREATININE 1.00  CALCIUM 9.5  PROT 6.9  BILITOT 1.1  ALKPHOS 69  ALT 34  AST 41*  GLUCOSE 119*   No results found for: PTT No results found for: INR, PROTIME Lab Results  Component Value Date   TROPONINI <0.03 07/17/2014    No results found for: CHOL No results found for: HDL No results found  for: LDLCALC No results found for: TRIG No results found for: CHOLHDL No results found for: LDLDIRECT    Radiology:  Ct Angio Chest Pe W/cm &/or Wo Cm  07/16/2014   CLINICAL DATA:  Acute chest pain and shortness of breath.  EXAM: CT ANGIOGRAPHY CHEST WITH CONTRAST  TECHNIQUE: Multidetector CT imaging of the chest was performed using the standard protocol during bolus administration of intravenous contrast. Multiplanar CT image reconstructions and MIPs were obtained to evaluate the vascular anatomy.  CONTRAST:  80mL OMNIPAQUE IOHEXOL 350 MG/ML SOLN  COMPARISON:  Chest radiograph of same day. CT scan of January 21, 2011.  FINDINGS: No pneumothorax or pleural effusion is noted. 6 mm subpleural nodule is noted laterally in the left lower lobe. This is new since prior exam 4 mm nodule is noted adjacent to major fissure laterally in right lower lobe which is unchanged compared to prior exam. 6 mm nodule is noted inferiorly in the right lower lobe best seen on image number 49 of series 2.  There is no evidence of pulmonary embolus. There is no evidence of thoracic aortic dissection or aneurysm. 17 x 14 mm left hilar lymph node is noted. 14 x 13 mm right sub carinal lymph node is noted. Within the visualized portion of the abdomen, porta hepatis adenopathy is noted which is not significantly changed compared to prior CT scan.  Review of the MIP images confirms the above findings.  IMPRESSION: No evidence of pulmonary embolus.  Left hilar and right subcarinal lymph nodes are noted as described above. It is uncertain if these are inflammatory or metastatic in etiology. Bilateral pulmonary nodules are noted, at least 2 of which are not visualized on prior CT scan. The largest of these measures 6 mm. If the patient is at high risk for bronchogenic carcinoma, follow-up chest CT at 6-12 months is recommended. If the patient is at low risk for bronchogenic carcinoma, follow-up chest CT at 12 months is recommended. This  recommendation follows the consensus statement: Guidelines for Management of Small Pulmonary Nodules Detected on CT Scans: A Statement from the Fleischner Society as published in Radiology 2005;237:395-400.   Electronically Signed   By: Roque Lias M.D.   On: 07/16/2014 15:34   Dg Chest Port 1 View  07/16/2014   CLINICAL DATA:  Left-sided chest pain.  EXAM: PORTABLE CHEST - 1 VIEW  COMPARISON:  None.  FINDINGS: There are low lung volumes with crowding of the interstitial markings. There is no focal  parenchymal opacity, pleural effusion, or pneumothorax. The heart and mediastinal contours are unremarkable.  The osseous structures are unremarkable.  IMPRESSION: No active disease.   Electronically Signed   By: Elige KoHetal  Patel   On: 07/16/2014 13:17    EKG:  NSR with no ST changes  ASSESSMENT/PLAN:  1.  Chest pain that is worrisome for coronary ischemia by description but there are no ischemic changes on EKG and normal cardiac enzymes despite an hour of constant CP.  His D-Dimer is mildly elevated with Chest CT angio with no PE.  Will get a stress myoview to rule out ischemia.  Check 2D echo to assess LVF given SOB. 2.  HTN - controlled.  Continue ACE I/HCTZ.  BB on hold due to bradycardia 3.  Dyslipidemia - restart statin home dose 4.  Ongoing tobacco abuse - needs smoking cessation counseling.  Quintella ReichertURNER,TRACI R, MD  07/17/2014  9:15 AM

## 2014-07-17 NOTE — Progress Notes (Signed)
Influenza PCR negative 

## 2014-07-17 NOTE — Progress Notes (Signed)
  Echocardiogram 2D Echocardiogram has been performed.  Leta JunglingCooper, [name] M 07/17/2014, 2:35 PM

## 2014-07-23 ENCOUNTER — Telehealth: Payer: Self-pay | Admitting: Cardiology

## 2014-07-23 NOTE — Telephone Encounter (Signed)
New Message  Pt wife called states that she spoke with Dr. Johney FrameAllred personally and was informed that it is ok to schedule an OV//new pt appt for this pt with himself. Please confirm that this is Ok to schedule

## 2014-07-25 ENCOUNTER — Ambulatory Visit (INDEPENDENT_AMBULATORY_CARE_PROVIDER_SITE_OTHER): Payer: 59 | Admitting: Internal Medicine

## 2014-07-25 ENCOUNTER — Encounter: Payer: Self-pay | Admitting: Internal Medicine

## 2014-07-25 VITALS — BP 118/72 | HR 66 | Ht 72.0 in | Wt 244.0 lb

## 2014-07-25 DIAGNOSIS — R079 Chest pain, unspecified: Secondary | ICD-10-CM

## 2014-07-25 DIAGNOSIS — F329 Major depressive disorder, single episode, unspecified: Secondary | ICD-10-CM

## 2014-07-25 DIAGNOSIS — R0602 Shortness of breath: Secondary | ICD-10-CM

## 2014-07-25 DIAGNOSIS — I10 Essential (primary) hypertension: Secondary | ICD-10-CM

## 2014-07-25 DIAGNOSIS — R42 Dizziness and giddiness: Secondary | ICD-10-CM | POA: Insufficient documentation

## 2014-07-25 DIAGNOSIS — F32A Depression, unspecified: Secondary | ICD-10-CM | POA: Insufficient documentation

## 2014-07-25 MED ORDER — SERTRALINE HCL 50 MG PO TABS: 50.0000 mg | ORAL_TABLET | Freq: Every day | ORAL | Status: DC

## 2014-07-25 MED ORDER — PANTOPRAZOLE SODIUM 40 MG PO TBEC: 40.0000 mg | DELAYED_RELEASE_TABLET | Freq: Every day | ORAL | Status: DC

## 2014-07-25 NOTE — Progress Notes (Signed)
Cardiology Office Note   Date:  07/25/2014   ID:  Timothy May, DOB May 23, 1961, MRN [id number] PCP: Dr Izola Price  Chief Complaint  Patient presents with  . Chest Pain     History of Present Illness: Timothy May is a 54 y.o. male who presents today for cardiology evaluation.   He has been struggling for about 6 weeks with multiple medical complaints.  He reports having headaches, nausea, decreased appetite, chest discomfort, and dizziness.  He recently presented to [facility] and had an extensive workup.  He had doppler which was negative for DVT, CXR which was normal, Chest CT which revealed several pulmonary nodules but no PTE or other acute findings, and lab work which I have reviewed.  He also had a myoview which was low risk and a normal echo.  He says that he has occasional chest discomfort which is substernal but also with radiation frequently into his L shoulder.  He does not notice this in association with exertion or with diet.  He denies SOB.  He has an occasional dizziness which he has difficulty describing but denies presyncope, syncope, bleeding, or neurologic sequela.   He feels that his chest discomfort is occasionally worsened with stress.  He reports difficulty with his 35 year old son.  He has been struggling with this social situation for years but says that it has peaked over the past month or so.  He becomes tearful in the office discussing this today.  He says that he is depressed.  He is not eating and is having difficulty sleeping.  He is very clear that he is not having SI/HI.  He saw Dr Lenise Arena last week and says that he was instructed to consider an antidepressant which he declined at that time.  The patient is tolerating medications without difficulties and is otherwise without complaint today.    Past Medical History  Diagnosis Date  . Hypertension   . Hypercholesteremia   . Gout   . DDD (degenerative disc disease), lumbar     followed by Dr Newell Coral  .  Tobacco abuse    Past Surgical History  Procedure Laterality Date  . Appendectomy      20 years ago     Current Outpatient Prescriptions  Medication Sig Dispense Refill  . atorvastatin (LIPITOR) 40 MG tablet Take 40 mg by mouth daily.    . clonazePAM (KLONOPIN) 0.5 MG tablet Take 0.5-1 mg by mouth 2 (two) times daily as needed for anxiety.     Marland Kitchen ibuprofen (ADVIL,MOTRIN) 200 MG tablet Take 400-600 mg by mouth every 8 (eight) hours as needed.    Marland Kitchen lisinopril-hydrochlorothiazide (PRINZIDE,ZESTORETIC) 20-12.5 MG per tablet Take 1 tablet by mouth 2 (two) times daily.     . metoprolol succinate (TOPROL-XL) 25 MG 24 hr tablet Take 12.5 mg by mouth daily.      No current facility-administered medications for this visit.    Allergies:   Review of patient's allergies indicates no known allergies.   Social History:  The patient  reports that he has been smoking Cigarettes.  He has a 20 pack-year smoking history. He has never used smokeless tobacco. He reports that he drinks alcohol. He reports that he does not use illicit drugs.   Family History:  The patient's family history includes Heart attack in his father; Heart disease in his father; Stroke in his father.    ROS:  Please see the history of present illness.   All other systems are  reviewed and negative.    PHYSICAL EXAM: VS:  BP 118/72 mmHg  Pulse 66  Ht 6' (1.829 m)  Wt 244 lb (110.678 kg)  BMI 33.09 kg/m2 , BMI Body mass index is 33.09 kg/(m^2). GEN: overweight, well developed, in no acute distress HEENT: normal Neck: no JVD, carotid bruits, or masses Cardiac: RRR; no murmurs, rubs, or gallops,no edema  Respiratory:  clear to auscultation bilaterally, normal work of breathing GI: soft, nontender, nondistended, + BS MS: no deformity or atrophy Skin: warm and dry  Neuro:  Strength and sensation are intact Psych: depressed mood, tearful affect at times, very pleasant man  EKG:  EKG is ordered today. The ekg ordered today  shows sinus rhythm, normal ekg   Recent Labs: 07/16/2014: ALT 34; BUN 14; Creatinine 1.00; Hemoglobin 14.9; Platelets 170; Potassium 4.4; Sodium 136; TSH 1.858    Lipid Panel  No results found for: CHOL, TRIG, HDL, CHOLHDL, VLDL, LDLCALC, LDLDIRECT   Wt Readings from Last 3 Encounters:  07/25/14 244 lb (110.678 kg)  07/17/14 240 lb (108.863 kg)      Other studies Reviewed: Additional studies/ records that were reviewed today include: multiple epic records are reviewed as per HPI.  I have also spoken with Dr Lenise Arena partner (he is off today) who reviewed his clinical note with me.  We discussed and formulated the plan together.     ASSESSMENT AND PLAN:  1.  Chest pain The patients recent myoview suggests that he is low risk for ischemia. His ekg today is normal.  His echo is also normal.  Chest CT is also reviewed and did not reveal PTE or vascular cause for his symptoms.  I worry that given his nausea and discomfort that he could have gastritis/ early ulcer.  CBC did not reveal anemia. At this point, I think that the best next step is to start him on protonix and follow clinically. If his symptoms worsen or if he does not improve then further CV testing should be considered.  2. Depression The patient has multiple somatic complaints which I believe are due to depression.  He has substantial depression by my exam today.  He denies suicidal/ homicidal ideations and does appear to be safe.  I have spoken with Dr Lenise Arena partner today and we have decided to initiate zoloft  daily x 1 week, then increase to  daily.  The patient has follow-up already scheduled with Dr Lenise Arena 08/08/14.  I will stop Toprol today which could also contribute to these symptoms.  3. Dizziness If symptoms do not stop with above, I will consider stopping hctz on return  4. HTN Stable No change required today  5. Tobacco- cessation is strongly advised He is ready to quit    Current medicines are  reviewed at length with the patient today.   The patient does not have concerns regarding his medicines.  The following changes were made today:  none  Follow-up: He will follow-up with Dr Daphane Shepherd 08/08/14.  I would like to see him again in 4-5 weeks.  If his symptoms are improved then no further CV testing is planned.  If his symptoms are worsened then further testing (either cardiac CT or cath) may be required.  Today, I have spent over 40 minutes with the patient discussing his clinical depression and multiple somatic complaints.  More than 50% of the visit time today was spent on this issue.  I have also spoken at length with his wife by phone regarding the  same.    Signed, Timothy RangeJames Cher Franzoni, MD  07/25/2014 3:18 PM     [facility] [address] [place] [id number] [phone] (office) [phone] (fax)

## 2014-07-25 NOTE — Telephone Encounter (Signed)
He is coming in today at 3pm

## 2014-07-25 NOTE — Telephone Encounter (Signed)
Follow Up  Pt following up on seeing Dr. Johney FrameAllred ASAP. Claims to have spoken with him personally and wants to have his earliest appt. Please call back and discuss.

## 2014-07-25 NOTE — Telephone Encounter (Signed)
Timothy KaufmannMelissa has called patient and left a message on the home phone as the cell number we have is d/c'd to see if he could come today at 3pm

## 2014-07-25 NOTE — Patient Instructions (Addendum)
Your physician recommends that you schedule a follow-up appointment  6 week follow up appointment  Your physician has recommended you make the following change in your medication:  1) Stop Toprol 2) Start Protonix 40mg  daily 3) Start Zoloft 25mg  daily for 1 week then increase to 50mg  daily

## 2014-07-30 ENCOUNTER — Telehealth: Payer: Self-pay | Admitting: *Deleted

## 2014-07-30 NOTE — Telephone Encounter (Signed)
Referring doctors office called to cancel new patient appointment

## 2014-08-02 ENCOUNTER — Encounter: Payer: Self-pay | Admitting: Neurology

## 2014-08-02 ENCOUNTER — Ambulatory Visit (INDEPENDENT_AMBULATORY_CARE_PROVIDER_SITE_OTHER): Payer: 59 | Admitting: Neurology

## 2014-08-02 VITALS — BP 142/81 | HR 63 | Ht 72.0 in | Wt 244.6 lb

## 2014-08-02 DIAGNOSIS — R42 Dizziness and giddiness: Secondary | ICD-10-CM

## 2014-08-02 DIAGNOSIS — F411 Generalized anxiety disorder: Secondary | ICD-10-CM

## 2014-08-02 MED ORDER — TOPIRAMATE 25 MG PO TABS: ORAL_TABLET | ORAL | Status: DC

## 2014-08-02 NOTE — Patient Instructions (Addendum)
Topamax (topiramate) is a seizure medication that has an FDA approval for seizures and for migraine headache. Potential side effects of this medication include weight loss, cognitive slowing, tingling in the fingers and toes, and carbonated drinks will taste bad. If any significant side effects are noted on this drug, please contact our office.  Dizziness Dizziness is a common problem. It is a feeling of unsteadiness or light-headedness. You may feel like you are about to faint. Dizziness can lead to injury if you stumble or fall. A person of any age group can suffer from dizziness, but dizziness is more common in older adults. CAUSES  Dizziness can be caused by many different things, including:  Middle ear problems.  Standing for too long.  Infections.  An allergic reaction.  Aging.  An emotional response to something, such as the sight of blood.  Side effects of medicines.  Tiredness.  Problems with circulation or blood pressure.  Excessive use of alcohol or medicines, or illegal drug use.  Breathing too fast (hyperventilation).  An irregular heart rhythm (arrhythmia).  A low red blood cell count (anemia).  Pregnancy.  Vomiting, diarrhea, fever, or other illnesses that cause body fluid loss (dehydration).  Diseases or conditions such as Parkinson's disease, high blood pressure (hypertension), diabetes, and thyroid problems.  Exposure to extreme heat. DIAGNOSIS  Your health care provider will ask about your symptoms, perform a physical exam, and perform an electrocardiogram (ECG) to record the electrical activity of your heart. Your health care provider may also perform other heart or blood tests to determine the cause of your dizziness. These may include:  Transthoracic echocardiogram (TTE). During echocardiography, sound waves are used to evaluate how blood flows through your heart.  Transesophageal echocardiogram (TEE).  Cardiac monitoring. This allows your health  care provider to monitor your heart rate and rhythm in real time.  Holter monitor. This is a portable device that records your heartbeat and can help diagnose heart arrhythmias. It allows your health care provider to track your heart activity for several days if needed.  Stress tests by exercise or by giving medicine that makes the heart beat faster. TREATMENT  Treatment of dizziness depends on the cause of your symptoms and can vary greatly. HOME CARE INSTRUCTIONS   Drink enough fluids to keep your urine clear or pale yellow. This is especially important in very hot weather. In older adults, it is also important in cold weather.  Take your medicine exactly as directed if your dizziness is caused by medicines. When taking blood pressure medicines, it is especially important to get up slowly.  Rise slowly from chairs and steady yourself until you feel okay.  In the morning, first sit up on the side of the bed. When you feel okay, stand slowly while holding onto something until you know your balance is fine.  Move your legs often if you need to stand in one place for a long time. Tighten and relax your muscles in your legs while standing.  Have someone stay with you for 1-2 days if dizziness continues to be a problem. Do this until you feel you are well enough to stay alone. Have the person call your health care provider if he or she notices changes in you that are concerning.  Do not drive or use heavy machinery if you feel dizzy.  Do not drink alcohol. SEEK IMMEDIATE MEDICAL CARE IF:   Your dizziness or light-headedness gets worse.  You feel nauseous or vomit.  You have problems  talking, walking, or using your arms, hands, or legs.  You feel weak.  You are not thinking clearly or you have trouble forming sentences. It may take a friend or family member to notice this.  You have chest pain, abdominal pain, shortness of breath, or sweating.  Your vision changes.  You notice any  bleeding.  You have side effects from medicine that seems to be getting worse rather than better. MAKE SURE YOU:   Understand these instructions.  Will watch your condition.  Will get help right away if you are not doing well or get worse. Document Released: 12/01/2000 Document Revised: 06/12/2013 Document Reviewed: 12/25/2010 [facility] Patient Information 2015 [employer] [place]. This information is not intended to replace advice given to you by your health care provider. Make sure you discuss any questions you have with your health care provider.

## 2014-08-02 NOTE — Progress Notes (Signed)
Reason for visit: Dizziness  Timothy May is a 54 y.o. male  History of present illness:  Timothy May is a 54 year old right-handed white male who indicates a six-month history of some problems with memory and concentration. He has been somewhat forgetful over time. He has a pre-existing history of anxiety and depression, and he was recently evaluated for noncardiac chest pain. He began having headaches 6-8 weeks ago that are occurring 4 or 5 times a week. He does have a family history of headaches, his sister had severe migraine. The patient indicates that headaches are unusual for him, however. The headaches are in the frontal areas of the head, and are generally fairly responsive to Advil. The headache may last an hour with medication. The patient began having episodes of dizziness that began about one month ago. The episodes may occur on average once a week, lasting about 5 minutes. The patient describes a lightheaded floaty sensation, but there are some sensations of true vertigo as well. He may become staggery with the events. The patient has had some pervasive problems for a number of weeks with nausea. This is a daily occurrence, not clearly associated with the headaches only. He denies any visual disturbances, double vision, slurred speech, or problems with swallowing. He denies any gait disturbance when he is not dizzy. He denies issues controlling the bowels or the bladder. He has had no weakness of the extremities. Over the last 3 weeks, he has developed insomnia. He will get to sleep, but he wakes up at 1 or 2 in the morning, and he cannot get back to sleep. Fatigue has significantly worsened along with the insomnia. He comes to this office for an evaluation.  Past Medical History  Diagnosis Date  . Hypertension   . Hypercholesteremia   . Gout   . DDD (degenerative disc disease), lumbar     followed by Timothy May  . Tobacco abuse   . Renal calculus or stone     Past  Surgical History  Procedure Laterality Date  . Appendectomy      20 years ago    Family History  Problem Relation Age of Onset  . Heart attack Father     father had multiple MIs, first MI age 69s, multiple paternal family members with CAD in their 34s  . Heart disease Father   . Stroke Father   . Cancer Mother   . Cancer Sister     Social history:  reports that he has been smoking Cigarettes.  He has a 20 pack-year smoking history. He has never used smokeless tobacco. He reports that he drinks alcohol. He reports that he does not use illicit drugs.  Medications:  Current Outpatient Prescriptions on File Prior to Visit  Medication Sig Dispense Refill  . atorvastatin (LIPITOR) 40 MG tablet Take 40 mg by mouth daily.    . clonazePAM (KLONOPIN) 0.5 MG tablet Take 0.5-1 mg by mouth 2 (two) times daily as needed for anxiety.     Marland Kitchen ibuprofen (ADVIL,MOTRIN) 200 MG tablet Take 400-600 mg by mouth every 8 (eight) hours as needed.    Marland Kitchen lisinopril-hydrochlorothiazide (PRINZIDE,ZESTORETIC) 20-12.5 MG per tablet Take 1 tablet by mouth 2 (two) times daily.     . pantoprazole (PROTONIX) 40 MG tablet Take 1 tablet (40 mg total) by mouth daily. 30 tablet 11  . sertraline (ZOLOFT) 50 MG tablet Take 1 tablet (50 mg total) by mouth daily. 30 tablet 0   No current facility-administered  medications on file prior to visit.     No Known Allergies  ROS:  Out of a complete 14 system review of symptoms, the patient complains only of the following symptoms, and all other reviewed systems are negative.  Fatigue Chest pain Shortness of breath Feeling hot Memory loss, headache, dizziness, passing out Sleepiness  Blood pressure 142/81, pulse 63, height 6' (1.829 m), weight 244 lb 9.6 oz (110.95 kg).  Physical Exam  General: The patient is alert and cooperative at the time of the examination. The patient is minimally to moderately obese.  Eyes: Pupils are equal, round, and reactive to light. Discs  are flat bilaterally.  Ears: Tympanic membranes are clear bilaterally.  Neck: The neck is supple, no carotid bruits are noted.  Respiratory: The respiratory examination is clear.  Cardiovascular: The cardiovascular examination reveals a regular rate and rhythm, no obvious murmurs or rubs are noted.  Skin: Extremities are without significant edema.  Neurologic Exam  Mental status: The patient is alert and oriented x 3 at the time of the examination. The patient has apparent normal recent and remote memory, with an apparently normal attention span and concentration ability.  Cranial nerves: Facial symmetry is present. There is good sensation of the face to pinprick and soft touch bilaterally. The strength of the facial muscles and the muscles to head turning and shoulder shrug are normal bilaterally. Speech is well enunciated, no aphasia or dysarthria is noted. Extraocular movements are full. Visual fields are full. The tongue is midline, and the patient has symmetric elevation of the soft palate. No obvious hearing deficits are noted.  Motor: The motor testing reveals 5 over 5 strength of all 4 extremities. Good symmetric motor tone is noted throughout.  Sensory: Sensory testing is intact to pinprick, soft touch, vibration sensation, and position sense on all 4 extremities. No evidence of extinction is noted.  Coordination: Cerebellar testing reveals good finger-nose-finger and heel-to-shin bilaterally.  Gait and station: Gait is normal. Tandem gait is normal. Romberg is negative. No drift is seen.  Reflexes: Deep tendon reflexes are symmetric and normal bilaterally. Toes are downgoing bilaterally.   Assessment/Plan:  1. Episodic dizziness  2. Headache  3. Reported memory disorder  4. Insomnia  5. Fatigue  6. History of anxiety and depression  7. Noncardiac chest pain  The patient has had a multitude of symptoms that developed over the last several months. The patient has  developed chronic nausea, dizziness, headache, memory issues, insomnia, fatigue, and noncardiac chest pain. These issues may be a manifestation of anxiety and depression, but further neurologic evaluation is required. He will undergo MRI evaluation of the brain, and he will have blood work done today. He will follow-up in about 3 months. I will place him on Topamax for the headaches to see if this improves the headache and the dizziness.  Marlan Palau. Keith Neno Hohensee MD 08/02/2014 2:42 PM  [facility] [address] [place], [place] [id number]  Phone [phone] Fax [phone]

## 2014-08-03 LAB — SPECIMEN STATUS REPORT

## 2014-08-06 LAB — COPPER, SERUM: Copper: 133 ug/dL (ref 72–166)

## 2014-08-06 LAB — SEDIMENTATION RATE: Sed Rate: 20 mm/hr (ref 0–30)

## 2014-08-06 LAB — RPR: RPR: NONREACTIVE

## 2014-08-06 LAB — VITAMIN B12: Vitamin B-12: 554 pg/mL (ref 211–946)

## 2014-08-12 ENCOUNTER — Ambulatory Visit: Payer: 59 | Admitting: Cardiology

## 2014-08-15 ENCOUNTER — Ambulatory Visit (INDEPENDENT_AMBULATORY_CARE_PROVIDER_SITE_OTHER): Payer: 59

## 2014-08-15 DIAGNOSIS — F411 Generalized anxiety disorder: Secondary | ICD-10-CM

## 2014-08-15 DIAGNOSIS — R42 Dizziness and giddiness: Secondary | ICD-10-CM

## 2014-08-16 ENCOUNTER — Telehealth: Payer: Self-pay | Admitting: Neurology

## 2014-08-16 NOTE — Telephone Encounter (Signed)
I called the patient. MRI the brain shows minimal white matter changes, the patient does have hypertension, has headache that may be migraine. I discussed this with the patient. He is on the Topamax, and he continues to go up on the dose.   MRI brain 08/16/14:  IMPRESSION:  Mildly abnormal MRI brain (without) demonstrating 1. There are several non-specific, subcortical and juxtacortical, round, T2 hyperintensities noted in the bifrontal regions. These findings are non-specific and considerations include autoimmune, inflammatory, post-infectious, microvascular ischemic or migraine associated etiologies. 2. Moderate-large cavum septum pellucidum and cavum vergae noted, which can be normal variants. 3. No acute findings.

## 2014-09-02 ENCOUNTER — Encounter: Payer: Self-pay | Admitting: Internal Medicine

## 2014-09-02 ENCOUNTER — Ambulatory Visit (INDEPENDENT_AMBULATORY_CARE_PROVIDER_SITE_OTHER): Payer: 59 | Admitting: Internal Medicine

## 2014-09-02 VITALS — BP 130/78 | HR 71 | Ht 72.0 in | Wt 241.8 lb

## 2014-09-02 DIAGNOSIS — F411 Generalized anxiety disorder: Secondary | ICD-10-CM

## 2014-09-02 DIAGNOSIS — Z720 Tobacco use: Secondary | ICD-10-CM

## 2014-09-02 DIAGNOSIS — F329 Major depressive disorder, single episode, unspecified: Secondary | ICD-10-CM

## 2014-09-02 DIAGNOSIS — F32A Depression, unspecified: Secondary | ICD-10-CM

## 2014-09-02 DIAGNOSIS — I10 Essential (primary) hypertension: Secondary | ICD-10-CM

## 2014-09-02 DIAGNOSIS — R0789 Other chest pain: Secondary | ICD-10-CM

## 2014-09-02 MED ORDER — SERTRALINE HCL 100 MG PO TABS: 100.0000 mg | ORAL_TABLET | Freq: Every day | ORAL | Status: DC

## 2014-09-02 NOTE — Patient Instructions (Signed)
Your physician recommends that you schedule a follow-up appointment in: 4 months with Dr Johney FrameAllred  Your physician has recommended you make the following change in your medication:  1) Increase Zoloft to 100 mg daily

## 2014-09-03 ENCOUNTER — Encounter: Payer: Self-pay | Admitting: Internal Medicine

## 2014-09-03 NOTE — Progress Notes (Signed)
Cardiology Office Note   Date:  09/03/2014   ID:  COREON SIMKINS, DOB [birth year], MRN [id number] PCP: Dr Izola Price  Chief Complaint  Patient presents with  . Follow-up    Chest pain, SOB & dizziness     History of Present Illness: Timothy May is a 54 y.o. male who presents today for cardiology follow-up.  He is doing much better.  His CP is resolved.  His HA, nausea, and dizziness are also improved.  His depression is better however he continues to have anxiety and difficulty sleeping.  He is trying to quit smoking.  He is also starting to exercise.  The patient is tolerating medications without difficulties and is otherwise without complaint today.    Past Medical History  Diagnosis Date  . Hypertension   . Hypercholesteremia   . Gout   . DDD (degenerative disc disease), lumbar     followed by Dr Newell Coral  . Tobacco abuse   . Renal calculus or stone    Past Surgical History  Procedure Laterality Date  . Appendectomy      20 years ago     Current Outpatient Prescriptions  Medication Sig Dispense Refill  . atorvastatin (LIPITOR) 40 MG tablet Take 40 mg by mouth daily.    . clonazePAM (KLONOPIN) 0.5 MG tablet Take 0.5-1 mg by mouth 2 (two) times daily as needed for anxiety.     Marland Kitchen ibuprofen (ADVIL,MOTRIN) 200 MG tablet Take 400-600 mg by mouth every 8 (eight) hours as needed (pain).     Marland Kitchen lisinopril-hydrochlorothiazide (PRINZIDE,ZESTORETIC) 20-12.5 MG per tablet Take 1 tablet by mouth 2 (two) times daily.     . pantoprazole (PROTONIX) 40 MG tablet Take 1 tablet (40 mg total) by mouth daily. 30 tablet 11  . sertraline (ZOLOFT) 100 MG tablet Take 1 tablet (100 mg total) by mouth daily. 90 tablet 3  . topiramate (TOPAMAX) 25 MG tablet Take 75 mg by mouth at bedtime.     No current facility-administered medications for this visit.    Allergies:   Review of patient's allergies indicates no known allergies.   Social History:  The patient  reports that he has been  smoking Cigarettes.  He has a 20 pack-year smoking history. He has never used smokeless tobacco. He reports that he drinks alcohol. He reports that he does not use illicit drugs.   Family History:  The patient's family history includes Cancer in his mother and sister; Heart attack in his father; Heart disease in his father; Stroke in his father.    ROS:  Please see the history of present illness.   All other systems are reviewed and negative.    PHYSICAL EXAM: VS:  BP 130/78 mmHg  Pulse 71  Ht 6' (1.829 m)  Wt 241 lb 12.8 oz (109.68 kg)  BMI 32.79 kg/m2 , BMI Body mass index is 32.79 kg/(m^2). GEN: overweight, well developed, in no acute distress HEENT: normal Neck: no JVD, carotid bruits, or masses Cardiac: RRR; no murmurs, rubs, or gallops,no edema  Respiratory:  clear to auscultation bilaterally, normal work of breathing GI: soft, nontender, nondistended, + BS MS: no deformity or atrophy Skin: warm and dry  Neuro:  Strength and sensation are intact Psych: anxious mood, full affect, very pleasant man   Recent Labs: 07/16/2014: ALT 34; BUN 14; Creatinine 1.00; Hemoglobin 14.9; Platelets 170; Potassium 4.4; Sodium 136; TSH 1.858    Lipid Panel  No results found for: CHOL, TRIG, HDL, CHOLHDL, VLDL, LDLCALC,  LDLDIRECT   Wt Readings from Last 3 Encounters:  09/02/14 241 lb 12.8 oz (109.68 kg)  08/02/14 244 lb 9.6 oz (110.95 kg)  07/25/14 244 lb (110.678 kg)    ASSESSMENT AND PLAN:  1.  Chest pain Resolved No further workup Consider stopping PPI in the future and then reassess symptoms.  2. Depression Improved but continues to have anxiety and difficulty sleeping.  [place] and this has improved his stress.  3. Dizziness Iimproved, I will consider stopping hctz on return if symptoms worsen  4. HTN Stable No change required today  5. Tobacco- cessation is strongly advised He is ready to quit    Current medicines are reviewed at length  with the patient today.   The patient does not have concerns regarding his medicines.  The following changes were made today:  none  Follow-up:   Return to see me in 4 months    Signed, Hillis RangeJames Lanya Bucks, MD  09/03/2014 11:06 PM     [facility] [address] [place] [address] [phone] (office) [phone] (fax)

## 2014-09-10 ENCOUNTER — Ambulatory Visit: Payer: 59 | Admitting: Neurology

## 2014-10-01 ENCOUNTER — Telehealth: Payer: Self-pay

## 2014-10-01 NOTE — Telephone Encounter (Signed)
Left voicemail asking patient to call back to reschedule follow up appointment from August to an earlier time in May, as that is when Dr. Anne HahnWillis wanted to see him back.

## 2014-10-21 ENCOUNTER — Telehealth: Payer: Self-pay

## 2014-10-21 NOTE — Telephone Encounter (Signed)
Left voicemail that we have an opening tomorrow (5/3) at 3:30. If the patient would like to come in, he will call back to schedule.

## 2015-01-30 ENCOUNTER — Ambulatory Visit: Payer: 59 | Admitting: Neurology

## 2015-01-30 ENCOUNTER — Telehealth: Payer: Self-pay

## 2015-01-30 NOTE — Telephone Encounter (Signed)
Patient did not come to a follow up appointment today.  

## 2015-02-11 ENCOUNTER — Encounter: Payer: Self-pay | Admitting: Neurology

## 2015-06-25 ENCOUNTER — Other Ambulatory Visit: Payer: Self-pay | Admitting: Family Medicine

## 2015-06-25 DIAGNOSIS — R911 Solitary pulmonary nodule: Secondary | ICD-10-CM

## 2015-07-08 ENCOUNTER — Other Ambulatory Visit: Payer: 59

## 2015-07-10 ENCOUNTER — Ambulatory Visit (HOSPITAL_COMMUNITY)
Admission: RE | Admit: 2015-07-10 | Discharge: 2015-07-10 | Disposition: A | Discharge status: Home / Self Care | Payer: 59 | Source: Ambulatory Visit | Attending: Family Medicine | Admitting: Family Medicine

## 2015-07-10 DIAGNOSIS — R911 Solitary pulmonary nodule: Secondary | ICD-10-CM

## 2015-07-10 DIAGNOSIS — R918 Other nonspecific abnormal finding of lung field: Secondary | ICD-10-CM | POA: Diagnosis not present

## 2015-07-10 MED ORDER — IOHEXOL 300 MG/ML  SOLN
75.0000 mL | Freq: Once | INTRAMUSCULAR | Status: AC | PRN
  Administered 2015-07-10: 75 mL via INTRAVENOUS

## 2015-08-08 MED FILL — ATORVASTATIN 40 MG TABLET: 40 | 90 days supply | Qty: 90 | Fill #1

## 2015-08-08 MED FILL — clonazePAM 1 MG TABS: 1 | 30 days supply | Qty: 60 | Fill #2

## 2015-08-08 MED FILL — LISINOPRIL-HCTZ 20-12.5 MG: 20-12.5 | 90 days supply | Qty: 180 | Fill #1

## 2015-08-08 MED FILL — VIAGRA 100 MG TABLET: 100 | 90 days supply | Qty: 18 | Fill #3

## 2015-08-13 DIAGNOSIS — F329 Major depressive disorder, single episode, unspecified: Secondary | ICD-10-CM | POA: Diagnosis not present

## 2015-08-13 DIAGNOSIS — Z6830 Body mass index (BMI) 30.0-30.9, adult: Secondary | ICD-10-CM | POA: Diagnosis not present

## 2015-08-13 DIAGNOSIS — E669 Obesity, unspecified: Secondary | ICD-10-CM | POA: Diagnosis not present

## 2015-08-13 DIAGNOSIS — I10 Essential (primary) hypertension: Secondary | ICD-10-CM | POA: Diagnosis not present

## 2015-08-13 DIAGNOSIS — F17210 Nicotine dependence, cigarettes, uncomplicated: Secondary | ICD-10-CM | POA: Diagnosis not present

## 2015-08-13 DIAGNOSIS — E785 Hyperlipidemia, unspecified: Secondary | ICD-10-CM | POA: Diagnosis not present

## 2015-08-13 DIAGNOSIS — F419 Anxiety disorder, unspecified: Secondary | ICD-10-CM | POA: Diagnosis not present

## 2015-08-13 MED FILL — CHANTIX STARTING MONTH BOX: 0.5 MG X 11 | 26 days supply | Qty: 53 | Fill #0

## 2015-08-13 MED FILL — busPIRone HCL 10 MG TABS: 10 | 90 days supply | Qty: 180 | Fill #0

## 2015-09-11 MED FILL — CHANTIX 1 MG CONT MONTH BOX: 1 | 28 days supply | Qty: 56 | Fill #0

## 2015-09-11 MED FILL — clonazePAM 1 MG TABS: 1 | 30 days supply | Qty: 60 | Fill #0

## 2015-10-17 MED FILL — clonazePAM 1 MG TABS: 1 | 30 days supply | Qty: 60 | Fill #1

## 2015-11-14 MED FILL — clonazePAM 1 MG TABS: 1 | 30 days supply | Qty: 60 | Fill #2

## 2015-12-03 MED FILL — HYDROCODON-APAP 5-325: 5-325 | 3 days supply | Qty: 20 | Fill #0

## 2015-12-03 MED FILL — CHLORHEXIDINE 0.12% RINSE: 0.12 | 20 days supply | Qty: 473 | Fill #0

## 2015-12-03 MED FILL — AMOXICILLIN 500 MG CAPSULE: 500 | 7 days supply | Qty: 28 | Fill #0

## 2016-01-27 MED FILL — VIAGRA 100 MG TABLET: 100 | 30 days supply | Qty: 6 | Fill #0

## 2016-01-27 MED FILL — clonazePAM 1 MG TABS: 1 | 30 days supply | Qty: 60 | Fill #3

## 2016-02-16 DIAGNOSIS — F419 Anxiety disorder, unspecified: Secondary | ICD-10-CM | POA: Diagnosis not present

## 2016-02-16 DIAGNOSIS — I10 Essential (primary) hypertension: Secondary | ICD-10-CM | POA: Diagnosis not present

## 2016-02-16 DIAGNOSIS — E785 Hyperlipidemia, unspecified: Secondary | ICD-10-CM | POA: Diagnosis not present

## 2016-02-16 MED FILL — ATORVASTATIN 40 MG TABLET: 40 | 90 days supply | Qty: 90 | Fill #0

## 2016-02-16 MED FILL — LISINOPRIL-HCTZ 20-12.5 MG: 20-12.5 | 90 days supply | Qty: 180 | Fill #0

## 2016-02-20 MED FILL — VIAGRA 100 MG TABLET: 100 | 90 days supply | Qty: 18 | Fill #0

## 2016-02-24 MED FILL — clonazePAM 1 MG TABS: 1 | 30 days supply | Qty: 60 | Fill #0

## 2016-03-23 IMAGING — CT CT CHEST W/ CM
2 of 4 series · 15 of 36 positions shown, 18 images · IV contrast (omnipaque)
Comparison: 07/16/2014

CLINICAL DATA: Follow-up pulmonary nodules seen 07/17/2014 on chest
CTA

EXAM:
CT CHEST WITH CONTRAST
TECHNIQUE: Multidetector CT imaging of the chest was performed during
intravenous contrast administration.
CONTRAST:  75mL OMNIPAQUE IOHEXOL 300 MG/ML  SOLN

[Series 2: chest with st · axial · 0.89mm/px · z∈[-368,-118]mm · 12 of 60 slices shown, 15 images]
[im 5/60  mediastinal]
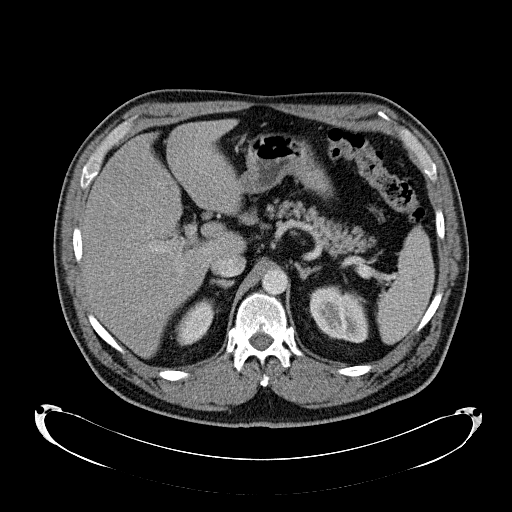
[im 5/60  lung]
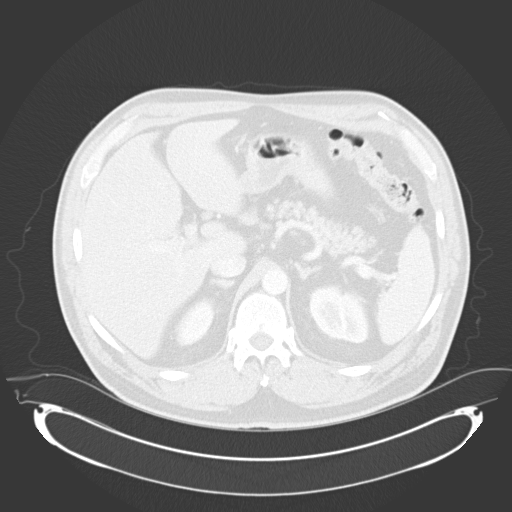
[im 9/60  lung]
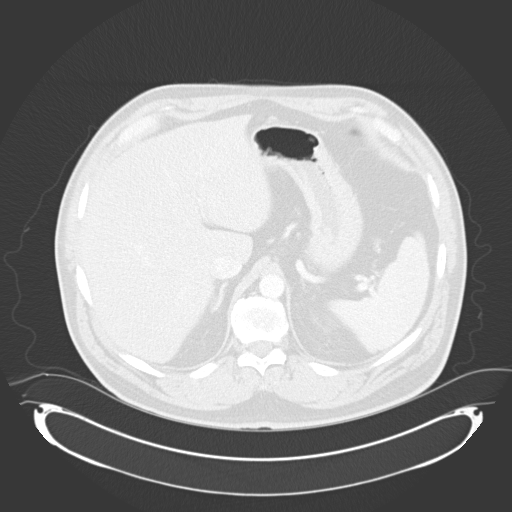
[im 13/60  lung]
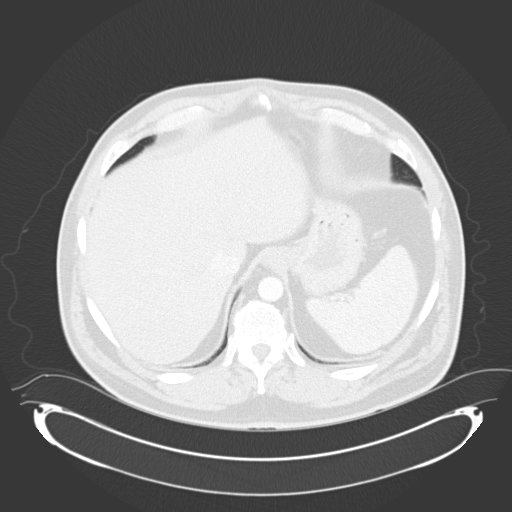
[im 17/60  lung]
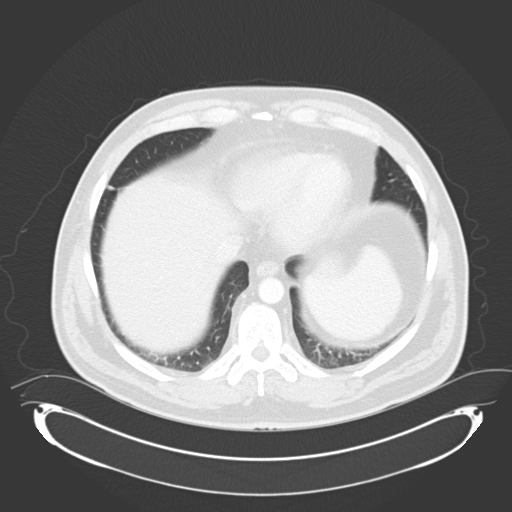
[im 22/60  mediastinal]
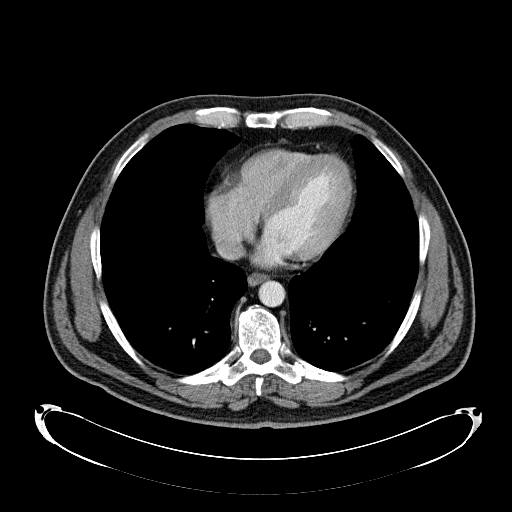
[im 22/60  lung]
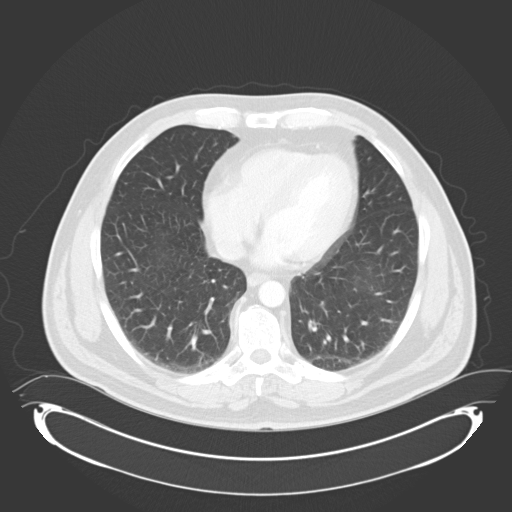
[im 26/60  lung]
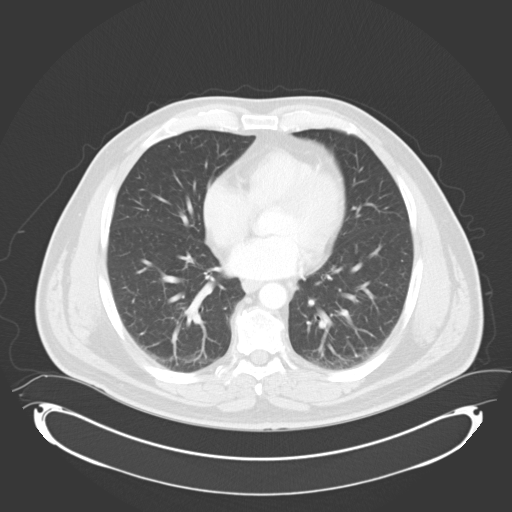
[im 34/60  lung]
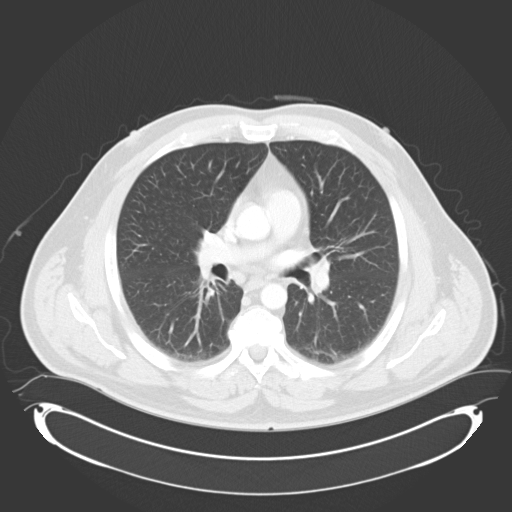
[im 38/60  lung]
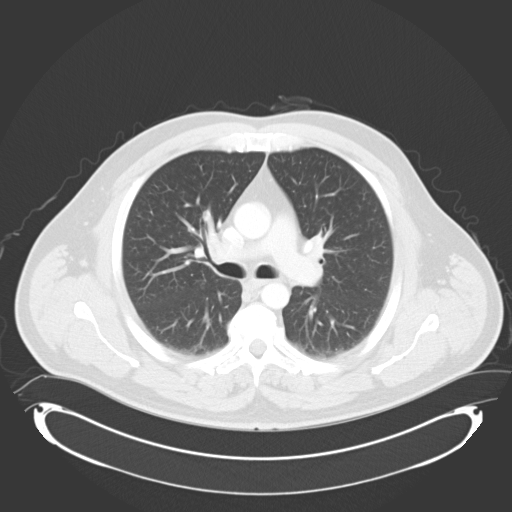
[im 43/60  mediastinal]
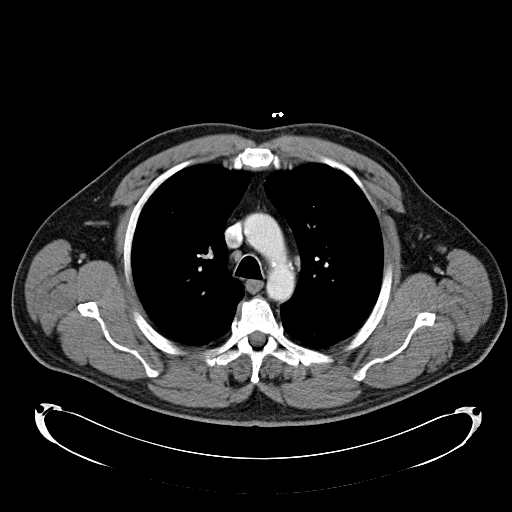
[im 43/60  lung]
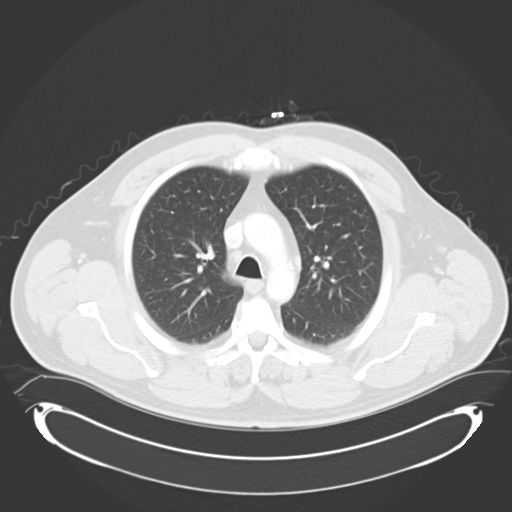
[im 47/60  lung]
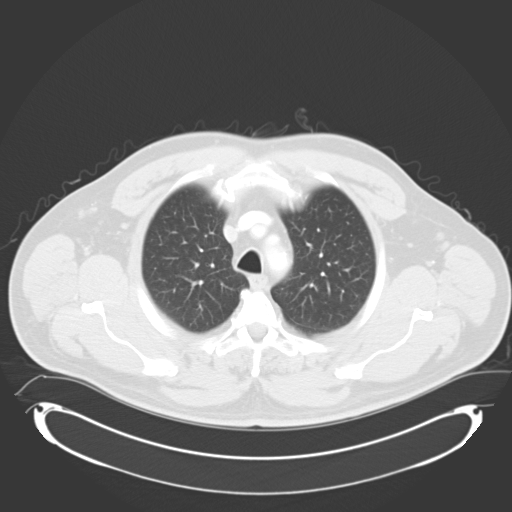
[im 51/60  lung]
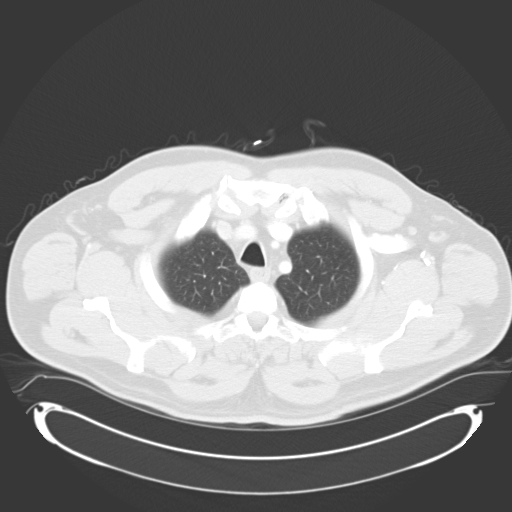
[im 55/60  lung]
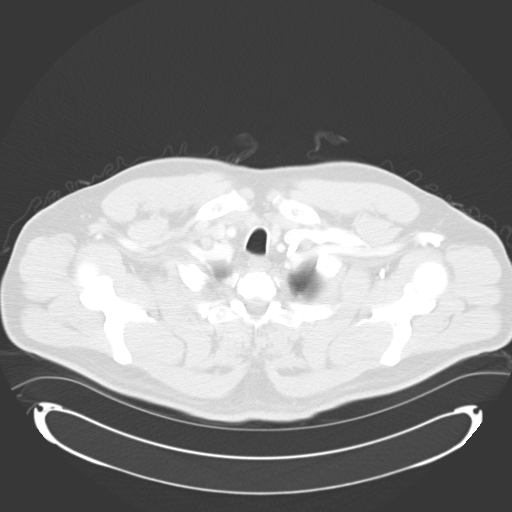

[Series 602: <mpr thick range> · coronal · 0.89mm/px · 3 of 97 slices shown]
[im 20/97  lung]
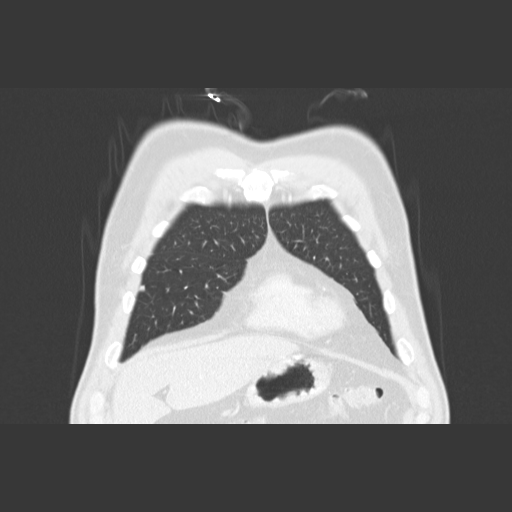
[im 39/97  lung]
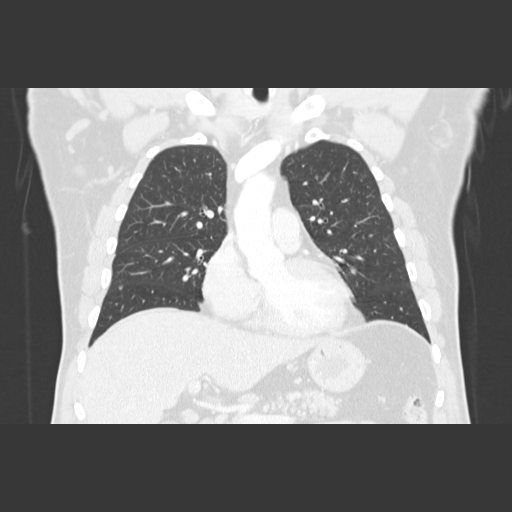
[im 58/97  lung]
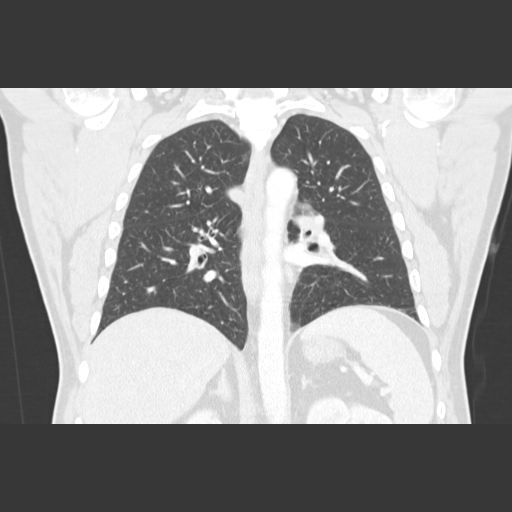

[15 of 36 positions shown; findings below may reference images not displayed]

FINDINGS: THORACIC INLET/BODY WALL:

No acute abnormality.

MEDIASTINUM:

Normal heart size. No pericardial effusion. No acute vascular
abnormality. Stable to diminished hilar and mediastinal lymph nodes,
benign-appearing

LUNG WINDOWS:

Eight nodules noted, marked on lung windows for follow-up purposes.
These have stable size measuring up to 6 mm in the right middle lobe
on series 5, image 37 and along the lower major fissure on the right
series 5, image 44. Most are peripheral and angulated and consistent
with lymph nodes. There is a non peripheral nodule in the right
lower lobe on series 5, image 28 which still has a reassuring flat
morphology and may be on an accessory fissure. This nodule measures
5 mm.

There is no edema, consolidation, effusion, or pneumothorax.

UPPER ABDOMEN:

Stable enlarged lymph nodes in the deep liver drainage, usually
incidental when seen in isolation.

OSSEOUS:

No acute fracture.  No suspicious lytic or blastic lesions.
IMPRESSION: 1. Stable bilateral pulmonary nodules which have a benign appearance
favoring subpleural lymph nodes. Based on smoking status and nodule
size, follow-up noncontrast chest CT in 1 year is recommended per
consensus guidelines.
2. At age 57, consider enrollment in chest CT lung cancer screening
program.

## 2016-04-17 ENCOUNTER — Ambulatory Visit: Payer: 59

## 2016-04-27 MED FILL — clonazePAM 1 MG TABS: 1 | 30 days supply | Qty: 60 | Fill #1

## 2016-06-19 DIAGNOSIS — H5203 Hypermetropia, bilateral: Secondary | ICD-10-CM | POA: Diagnosis not present

## 2016-06-29 MED FILL — clonazePAM 1 MG TABS: 1 | 30 days supply | Qty: 60 | Fill #2

## 2016-06-29 MED FILL — SILDENAFIL 100 MG TABLET: 100 | 30 days supply | Qty: 6 | Fill #1

## 2016-08-11 MED FILL — clonazePAM 1 MG TABS: 1 | 30 days supply | Qty: 60 | Fill #3

## 2016-08-11 MED FILL — SILDENAFIL 100 MG TABLET: 100 | 90 days supply | Qty: 18 | Fill #2 | Status: TO

## 2016-10-04 DIAGNOSIS — F419 Anxiety disorder, unspecified: Secondary | ICD-10-CM | POA: Diagnosis not present

## 2016-10-04 DIAGNOSIS — I10 Essential (primary) hypertension: Secondary | ICD-10-CM | POA: Diagnosis not present

## 2016-10-04 DIAGNOSIS — N529 Male erectile dysfunction, unspecified: Secondary | ICD-10-CM | POA: Diagnosis not present

## 2016-10-04 DIAGNOSIS — E785 Hyperlipidemia, unspecified: Secondary | ICD-10-CM | POA: Diagnosis not present

## 2016-10-07 MED FILL — AMLODIPINE BESYLATE 5 MG TA: 5 | 90 days supply | Qty: 90 | Fill #0 | Status: TO

## 2016-10-07 MED FILL — LISINOPRIL 40 MG TABLET: 40 | 90 days supply | Qty: 90 | Fill #0 | Status: TO

## 2016-10-07 MED FILL — ATORVASTATIN 40 MG TABLET: 40 | 90 days supply | Qty: 90 | Fill #0 | Status: TO

## 2017-01-17 MED FILL — SILDENAFIL 100 MG TABLET: 100 | 90 days supply | Qty: 18 | Fill #0

## 2017-06-20 DIAGNOSIS — H5203 Hypermetropia, bilateral: Secondary | ICD-10-CM | POA: Diagnosis not present

## 2017-07-13 DIAGNOSIS — F419 Anxiety disorder, unspecified: Secondary | ICD-10-CM | POA: Diagnosis not present

## 2017-07-13 DIAGNOSIS — E785 Hyperlipidemia, unspecified: Secondary | ICD-10-CM | POA: Diagnosis not present

## 2017-07-13 DIAGNOSIS — I10 Essential (primary) hypertension: Secondary | ICD-10-CM | POA: Diagnosis not present

## 2017-07-13 DIAGNOSIS — F17200 Nicotine dependence, unspecified, uncomplicated: Secondary | ICD-10-CM | POA: Diagnosis not present

## 2017-07-13 DIAGNOSIS — N529 Male erectile dysfunction, unspecified: Secondary | ICD-10-CM | POA: Diagnosis not present

## 2017-07-14 MED FILL — SILDENAFIL CITRATE 100 MG T: 100 | 90 days supply | Qty: 18 | Fill #0

## 2017-07-14 MED FILL — ATORVASTATIN 40 MG TABLET: 40 | 90 days supply | Qty: 90 | Fill #0

## 2017-07-14 MED FILL — CHANTIX STARTING MONTH BOX: 0.5 MG X 11 | 30 days supply | Qty: 53 | Fill #0

## 2017-07-14 MED FILL — LISINOPRIL 40 MG TABLET: 40 | 90 days supply | Qty: 90 | Fill #0

## 2017-07-14 MED FILL — AMLODIPINE BESYLATE 10 MG T: 10 | 90 days supply | Qty: 90 | Fill #0

## 2017-07-14 MED FILL — clonazePAM 1 MG TABS: 1 | 30 days supply | Qty: 60 | Fill #0

## 2017-10-14 MED FILL — clonazePAM 1 MG TABS: 1 | 30 days supply | Qty: 60 | Fill #1

## 2017-10-21 MED FILL — SILDENAFIL 20 MG TABLET: 20 | 30 days supply | Qty: 30 | Fill #0

## 2017-10-21 MED FILL — CHANTIX 1 MG CONT MONTH BOX: 1 | 28 days supply | Qty: 56 | Fill #0

## 2017-12-14 DIAGNOSIS — M25512 Pain in left shoulder: Secondary | ICD-10-CM | POA: Diagnosis not present

## 2017-12-14 MED FILL — predniSONE 20 MG TABS: 20 | 9 days supply | Qty: 18 | Fill #0

## 2017-12-14 MED FILL — clonazePAM 1 MG TABS: 1 | 30 days supply | Qty: 60 | Fill #2

## 2017-12-20 MED FILL — VARDENAFIL HCL 20 MG TABS: 20 | 60 days supply | Qty: 12 | Fill #0

## 2018-02-13 DIAGNOSIS — I10 Essential (primary) hypertension: Secondary | ICD-10-CM | POA: Diagnosis not present

## 2018-02-13 DIAGNOSIS — Z125 Encounter for screening for malignant neoplasm of prostate: Secondary | ICD-10-CM | POA: Diagnosis not present

## 2018-02-13 DIAGNOSIS — E785 Hyperlipidemia, unspecified: Secondary | ICD-10-CM | POA: Diagnosis not present

## 2018-02-13 DIAGNOSIS — N529 Male erectile dysfunction, unspecified: Secondary | ICD-10-CM | POA: Diagnosis not present

## 2018-02-13 DIAGNOSIS — F419 Anxiety disorder, unspecified: Secondary | ICD-10-CM | POA: Diagnosis not present

## 2018-02-13 MED FILL — LISINOPRIL 40 MG TABLET: 40 | 90 days supply | Qty: 90 | Fill #0

## 2018-02-13 MED FILL — AMLODIPINE BESYLATE 10 MG T: 10 | 90 days supply | Qty: 90 | Fill #0

## 2018-02-13 MED FILL — ATORVASTATIN 40 MG TABLET: 40 | 90 days supply | Qty: 90 | Fill #0

## 2018-02-13 MED FILL — ATENOLOL 25 MG TABLET: 25 | 90 days supply | Qty: 90 | Fill #0

## 2018-02-16 MED FILL — TADALAFIL 20 MG TABS: 20 | 90 days supply | Qty: 18 | Fill #0

## 2018-02-16 MED FILL — clonazePAM 1 MG TABS: 1 | 30 days supply | Qty: 60 | Fill #0

## 2018-02-28 MED FILL — ATENOLOL 25 MG TABLET: 25 | 90 days supply | Qty: 90 | Fill #1

## 2018-02-28 MED FILL — AMLODIPINE BESYLATE 10 MG T: 10 | 90 days supply | Qty: 90 | Fill #1

## 2018-07-25 MED FILL — clonazePAM 1 MG TABS: 1 | 30 days supply | Qty: 60 | Fill #1

## 2018-07-25 MED FILL — TADALAFIL 20 MG TABS: 20 | 90 days supply | Qty: 18 | Fill #1 | Status: TO

## 2018-09-19 DIAGNOSIS — E785 Hyperlipidemia, unspecified: Secondary | ICD-10-CM | POA: Diagnosis not present

## 2018-09-19 DIAGNOSIS — F419 Anxiety disorder, unspecified: Secondary | ICD-10-CM | POA: Diagnosis not present

## 2018-09-19 DIAGNOSIS — I10 Essential (primary) hypertension: Secondary | ICD-10-CM | POA: Diagnosis not present

## 2018-09-19 DIAGNOSIS — F17200 Nicotine dependence, unspecified, uncomplicated: Secondary | ICD-10-CM | POA: Diagnosis not present

## 2018-10-12 MED FILL — TADALAFIL 20 MG TABS: 20 | 90 days supply | Qty: 18 | Fill #0

## 2018-10-12 MED FILL — LISINOPRIL 40 MG TABLET: 40 | 90 days supply | Qty: 90 | Fill #0

## 2018-10-12 MED FILL — CHANTIX STARTING MONTH BOX: 0.5 MG X 11 | 28 days supply | Qty: 53 | Fill #0

## 2018-10-12 MED FILL — ATENOLOL 25 MG TABLET: 25 | 90 days supply | Qty: 90 | Fill #0

## 2018-10-12 MED FILL — ATORVASTATIN 40 MG TABLET: 40 | 90 days supply | Qty: 90 | Fill #0

## 2018-10-12 MED FILL — AMLODIPINE BESYLATE 10 MG T: 10 | 90 days supply | Qty: 90 | Fill #0

## 2018-10-13 MED FILL — clonazePAM 1 MG TABS: 1 | 30 days supply | Qty: 60 | Fill #0

## 2019-02-25 ENCOUNTER — Other Ambulatory Visit: Payer: Self-pay

## 2019-02-25 ENCOUNTER — Inpatient Hospital Stay (HOSPITAL_COMMUNITY)
Admission: EM | Admit: 2019-02-25 | Discharge: 2019-02-27 | DRG: 247 | Disposition: A | Discharge status: Home / Self Care | Payer: 59 | Attending: Cardiovascular Disease | Admitting: Cardiovascular Disease

## 2019-02-25 ENCOUNTER — Encounter (HOSPITAL_COMMUNITY): Payer: Self-pay | Admitting: Emergency Medicine

## 2019-02-25 DIAGNOSIS — I152 Hypertension secondary to endocrine disorders: Secondary | ICD-10-CM | POA: Diagnosis present

## 2019-02-25 DIAGNOSIS — Z720 Tobacco use: Secondary | ICD-10-CM | POA: Diagnosis present

## 2019-02-25 DIAGNOSIS — R079 Chest pain, unspecified: Secondary | ICD-10-CM | POA: Diagnosis present

## 2019-02-25 DIAGNOSIS — M109 Gout, unspecified: Secondary | ICD-10-CM | POA: Diagnosis present

## 2019-02-25 DIAGNOSIS — I2510 Atherosclerotic heart disease of native coronary artery without angina pectoris: Secondary | ICD-10-CM | POA: Diagnosis not present

## 2019-02-25 DIAGNOSIS — I252 Old myocardial infarction: Secondary | ICD-10-CM

## 2019-02-25 DIAGNOSIS — I493 Ventricular premature depolarization: Secondary | ICD-10-CM | POA: Diagnosis not present

## 2019-02-25 DIAGNOSIS — I2119 ST elevation (STEMI) myocardial infarction involving other coronary artery of inferior wall: Principal | ICD-10-CM | POA: Diagnosis present

## 2019-02-25 DIAGNOSIS — I213 ST elevation (STEMI) myocardial infarction of unspecified site: Secondary | ICD-10-CM

## 2019-02-25 DIAGNOSIS — I25119 Atherosclerotic heart disease of native coronary artery with unspecified angina pectoris: Secondary | ICD-10-CM | POA: Diagnosis not present

## 2019-02-25 DIAGNOSIS — Z20828 Contact with and (suspected) exposure to other viral communicable diseases: Secondary | ICD-10-CM | POA: Diagnosis not present

## 2019-02-25 DIAGNOSIS — I10 Essential (primary) hypertension: Secondary | ICD-10-CM | POA: Diagnosis present

## 2019-02-25 DIAGNOSIS — R0789 Other chest pain: Secondary | ICD-10-CM | POA: Diagnosis not present

## 2019-02-25 DIAGNOSIS — E118 Type 2 diabetes mellitus with unspecified complications: Secondary | ICD-10-CM | POA: Diagnosis not present

## 2019-02-25 DIAGNOSIS — Z79899 Other long term (current) drug therapy: Secondary | ICD-10-CM

## 2019-02-25 DIAGNOSIS — F329 Major depressive disorder, single episode, unspecified: Secondary | ICD-10-CM | POA: Diagnosis present

## 2019-02-25 DIAGNOSIS — E785 Hyperlipidemia, unspecified: Secondary | ICD-10-CM | POA: Diagnosis not present

## 2019-02-25 DIAGNOSIS — I472 Ventricular tachycardia: Secondary | ICD-10-CM | POA: Diagnosis not present

## 2019-02-25 DIAGNOSIS — F17210 Nicotine dependence, cigarettes, uncomplicated: Secondary | ICD-10-CM | POA: Diagnosis present

## 2019-02-25 DIAGNOSIS — E1159 Type 2 diabetes mellitus with other circulatory complications: Secondary | ICD-10-CM | POA: Diagnosis present

## 2019-02-25 DIAGNOSIS — Z955 Presence of coronary angioplasty implant and graft: Secondary | ICD-10-CM

## 2019-02-25 DIAGNOSIS — I2111 ST elevation (STEMI) myocardial infarction involving right coronary artery: Secondary | ICD-10-CM | POA: Diagnosis not present

## 2019-02-25 DIAGNOSIS — Z8249 Family history of ischemic heart disease and other diseases of the circulatory system: Secondary | ICD-10-CM

## 2019-02-25 DIAGNOSIS — Z87442 Personal history of urinary calculi: Secondary | ICD-10-CM

## 2019-02-25 DIAGNOSIS — M5136 Other intervertebral disc degeneration, lumbar region: Secondary | ICD-10-CM | POA: Diagnosis present

## 2019-02-25 DIAGNOSIS — R0602 Shortness of breath: Secondary | ICD-10-CM | POA: Diagnosis not present

## 2019-02-25 DIAGNOSIS — K219 Gastro-esophageal reflux disease without esophagitis: Secondary | ICD-10-CM | POA: Diagnosis present

## 2019-02-25 HISTORY — DX: Hyperlipidemia, unspecified: E78.5

## 2019-02-25 HISTORY — DX: ST elevation (STEMI) myocardial infarction of unspecified site: I21.3

## 2019-02-25 HISTORY — DX: Type 2 diabetes mellitus without complications: E11.9

## 2019-02-25 MED ORDER — HEPARIN SODIUM (PORCINE) 5000 UNIT/ML IJ SOLN
INTRAMUSCULAR | Status: AC
  Administered 2019-02-26: 4000 [IU]
  Filled 2019-02-25: qty 1

## 2019-02-25 MED ORDER — SODIUM CHLORIDE 0.9 % IV SOLN
INTRAVENOUS | Status: DC
  Administered 2019-02-26: via INTRAVENOUS

## 2019-02-25 MED ORDER — SODIUM CHLORIDE 0.9% FLUSH: 3.0000 mL | Freq: Once | INTRAVENOUS | Status: DC

## 2019-02-25 MED ORDER — ASPIRIN 81 MG PO CHEW
CHEWABLE_TABLET | ORAL | Status: AC
  Filled 2019-02-25: qty 4

## 2019-02-25 MED ORDER — ASPIRIN 81 MG PO CHEW: 324.0000 mg | CHEWABLE_TABLET | Freq: Once | ORAL | Status: DC

## 2019-02-25 NOTE — ED Triage Notes (Signed)
Pt to ED with c/o mid upper chest pain, onset this am.  Pt st's pain radiates into left chest and left arm   Also st's at times pain would radiate into jaw.

## 2019-02-26 ENCOUNTER — Emergency Department (HOSPITAL_COMMUNITY): Payer: 59

## 2019-02-26 ENCOUNTER — Encounter (HOSPITAL_COMMUNITY)
Admission: EM | Disposition: A | Discharge status: Home / Self Care | Payer: Self-pay | Source: Home / Self Care | Attending: Cardiology

## 2019-02-26 ENCOUNTER — Other Ambulatory Visit: Payer: Self-pay

## 2019-02-26 ENCOUNTER — Inpatient Hospital Stay (HOSPITAL_COMMUNITY): Payer: 59

## 2019-02-26 DIAGNOSIS — Z87442 Personal history of urinary calculi: Secondary | ICD-10-CM | POA: Diagnosis not present

## 2019-02-26 DIAGNOSIS — I10 Essential (primary) hypertension: Secondary | ICD-10-CM | POA: Diagnosis not present

## 2019-02-26 DIAGNOSIS — K219 Gastro-esophageal reflux disease without esophagitis: Secondary | ICD-10-CM | POA: Diagnosis present

## 2019-02-26 DIAGNOSIS — I472 Ventricular tachycardia: Secondary | ICD-10-CM | POA: Diagnosis present

## 2019-02-26 DIAGNOSIS — Z79899 Other long term (current) drug therapy: Secondary | ICD-10-CM | POA: Diagnosis not present

## 2019-02-26 DIAGNOSIS — I2119 ST elevation (STEMI) myocardial infarction involving other coronary artery of inferior wall: Secondary | ICD-10-CM | POA: Diagnosis not present

## 2019-02-26 DIAGNOSIS — Z20828 Contact with and (suspected) exposure to other viral communicable diseases: Secondary | ICD-10-CM | POA: Diagnosis present

## 2019-02-26 DIAGNOSIS — E785 Hyperlipidemia, unspecified: Secondary | ICD-10-CM | POA: Diagnosis not present

## 2019-02-26 DIAGNOSIS — M109 Gout, unspecified: Secondary | ICD-10-CM | POA: Diagnosis present

## 2019-02-26 DIAGNOSIS — Z8249 Family history of ischemic heart disease and other diseases of the circulatory system: Secondary | ICD-10-CM | POA: Diagnosis not present

## 2019-02-26 DIAGNOSIS — I213 ST elevation (STEMI) myocardial infarction of unspecified site: Secondary | ICD-10-CM | POA: Diagnosis not present

## 2019-02-26 DIAGNOSIS — F329 Major depressive disorder, single episode, unspecified: Secondary | ICD-10-CM | POA: Diagnosis present

## 2019-02-26 DIAGNOSIS — I252 Old myocardial infarction: Secondary | ICD-10-CM

## 2019-02-26 DIAGNOSIS — I2111 ST elevation (STEMI) myocardial infarction involving right coronary artery: Secondary | ICD-10-CM | POA: Diagnosis not present

## 2019-02-26 DIAGNOSIS — M5136 Other intervertebral disc degeneration, lumbar region: Secondary | ICD-10-CM | POA: Diagnosis present

## 2019-02-26 DIAGNOSIS — I2510 Atherosclerotic heart disease of native coronary artery without angina pectoris: Secondary | ICD-10-CM | POA: Diagnosis not present

## 2019-02-26 DIAGNOSIS — R079 Chest pain, unspecified: Secondary | ICD-10-CM | POA: Diagnosis not present

## 2019-02-26 DIAGNOSIS — I25119 Atherosclerotic heart disease of native coronary artery with unspecified angina pectoris: Secondary | ICD-10-CM | POA: Diagnosis present

## 2019-02-26 DIAGNOSIS — I493 Ventricular premature depolarization: Secondary | ICD-10-CM | POA: Diagnosis present

## 2019-02-26 DIAGNOSIS — R0789 Other chest pain: Secondary | ICD-10-CM | POA: Diagnosis present

## 2019-02-26 DIAGNOSIS — F17210 Nicotine dependence, cigarettes, uncomplicated: Secondary | ICD-10-CM | POA: Diagnosis present

## 2019-02-26 DIAGNOSIS — E118 Type 2 diabetes mellitus with unspecified complications: Secondary | ICD-10-CM | POA: Diagnosis present

## 2019-02-26 HISTORY — PX: CORONARY/GRAFT ACUTE MI REVASCULARIZATION: CATH118305

## 2019-02-26 HISTORY — PX: CORONARY STENT INTERVENTION: CATH118234

## 2019-02-26 HISTORY — PX: LEFT HEART CATH AND CORONARY ANGIOGRAPHY: CATH118249

## 2019-02-26 LAB — CBC
HCT: 42.1 % (ref 39.0–52.0)
HCT: 46.4 % (ref 39.0–52.0)
Hemoglobin: 13.6 g/dL (ref 13.0–17.0)
Hemoglobin: 14.7 g/dL (ref 13.0–17.0)
MCH: 27 pg (ref 26.0–34.0)
MCH: 27.3 pg (ref 26.0–34.0)
MCHC: 31.7 g/dL (ref 30.0–36.0)
MCHC: 32.3 g/dL (ref 30.0–36.0)
MCV: 83.7 fL (ref 80.0–100.0)
MCV: 86.1 fL (ref 80.0–100.0)
Platelets: 127 10*3/uL — ABNORMAL LOW (ref 150–400)
Platelets: 143 10*3/uL — ABNORMAL LOW (ref 150–400)
RBC: 5.03 MIL/uL (ref 4.22–5.81)
RBC: 5.39 MIL/uL (ref 4.22–5.81)
RDW: 14.5 % (ref 11.5–15.5)
RDW: 14.6 % (ref 11.5–15.5)
WBC: 6.8 10*3/uL (ref 4.0–10.5)
WBC: 7.1 10*3/uL (ref 4.0–10.5)
nRBC: 0 % (ref 0.0–0.2)
nRBC: 0 % (ref 0.0–0.2)

## 2019-02-26 LAB — ECHOCARDIOGRAM COMPLETE
Height: 72 in
Weight: 3943.59 oz

## 2019-02-26 LAB — SARS CORONAVIRUS 2 BY RT PCR (HOSPITAL ORDER, PERFORMED IN ~~LOC~~ HOSPITAL LAB): SARS Coronavirus 2: NEGATIVE

## 2019-02-26 LAB — LIPID PANEL
Cholesterol: 247 mg/dL — ABNORMAL HIGH (ref 0–200)
HDL: 34 mg/dL — ABNORMAL LOW (ref >40)
LDL Cholesterol: UNDETERMINED mg/dL (ref 0–99)
Total CHOL/HDL Ratio: 7.3 RATIO
Triglycerides: 718 mg/dL — ABNORMAL HIGH (ref <150)
VLDL: UNDETERMINED mg/dL (ref 0–40)

## 2019-02-26 LAB — BASIC METABOLIC PANEL
Anion gap: 12 (ref 5–15)
Anion gap: 11 (ref 5–15)
BUN: 16 mg/dL (ref 6–20)
BUN: 17 mg/dL (ref 6–20)
CO2: 20 mmol/L — ABNORMAL LOW (ref 22–32)
CO2: 20 mmol/L — ABNORMAL LOW (ref 22–32)
Calcium: 8.7 mg/dL — ABNORMAL LOW (ref 8.9–10.3)
Calcium: 8.5 mg/dL — ABNORMAL LOW (ref 8.9–10.3)
Chloride: 102 mmol/L (ref 98–111)
Chloride: 104 mmol/L (ref 98–111)
Creatinine, Ser: 0.97 mg/dL (ref 0.61–1.24)
Creatinine, Ser: 0.83 mg/dL (ref 0.61–1.24)
GFR calc Af Amer: >60 mL/min (ref >60)
GFR calc Af Amer: >60 mL/min (ref >60)
GFR calc non Af Amer: >60 mL/min (ref >60)
GFR calc non Af Amer: >60 mL/min (ref >60)
Glucose, Bld: 276 mg/dL — ABNORMAL HIGH (ref 70–99)
Glucose, Bld: 150 mg/dL — ABNORMAL HIGH (ref 70–99)
Potassium: 3.5 mmol/L (ref 3.5–5.1)
Potassium: 3.8 mmol/L (ref 3.5–5.1)
Sodium: 134 mmol/L — ABNORMAL LOW (ref 135–145)
Sodium: 135 mmol/L (ref 135–145)

## 2019-02-26 LAB — GLUCOSE, CAPILLARY
Glucose-Capillary: 193 mg/dL — ABNORMAL HIGH (ref 70–99)
Glucose-Capillary: 155 mg/dL — ABNORMAL HIGH (ref 70–99)
Glucose-Capillary: 109 mg/dL — ABNORMAL HIGH (ref 70–99)
Glucose-Capillary: 130 mg/dL — ABNORMAL HIGH (ref 70–99)

## 2019-02-26 LAB — BRAIN NATRIURETIC PEPTIDE: B Natriuretic Peptide: 28.7 pg/mL (ref 0.0–100.0)

## 2019-02-26 LAB — LDL CHOLESTEROL, DIRECT: Direct LDL: 146.7 mg/dL — ABNORMAL HIGH (ref 0–99)

## 2019-02-26 LAB — PROTIME-INR
INR: 0.9 (ref 0.8–1.2)
Prothrombin Time: 12.4 seconds (ref 11.4–15.2)

## 2019-02-26 LAB — APTT: aPTT: 31 seconds (ref 24–36)

## 2019-02-26 LAB — TSH: TSH: 2.862 u[IU]/mL (ref 0.350–4.500)

## 2019-02-26 LAB — HEMOGLOBIN A1C
Hgb A1c MFr Bld: 7 % — ABNORMAL HIGH (ref 4.8–5.6)
Mean Plasma Glucose: 154.2 mg/dL

## 2019-02-26 LAB — T4, FREE: Free T4: 1.13 ng/dL — ABNORMAL HIGH (ref 0.61–1.12)

## 2019-02-26 LAB — TROPONIN I (HIGH SENSITIVITY)
Troponin I (High Sensitivity): 40 ng/L — ABNORMAL HIGH (ref <18)
Troponin I (High Sensitivity): 361 ng/L (ref <18)

## 2019-02-26 LAB — MRSA PCR SCREENING: MRSA by PCR: NEGATIVE

## 2019-02-26 SURGERY — CORONARY/GRAFT ACUTE MI REVASCULARIZATION
Anesthesia: LOCAL

## 2019-02-26 MED ORDER — LIVING WELL WITH DIABETES BOOK
Freq: Once | Status: AC
  Administered 2019-02-26: 22:00:00
  Filled 2019-02-26: qty 1

## 2019-02-26 MED ORDER — TICAGRELOR 90 MG PO TABS
90.0000 mg | ORAL_TABLET | Freq: Two times a day (BID) | ORAL | Status: DC
  Administered 2019-02-26 – 2019-02-27 (×3): 90 mg via ORAL
  Filled 2019-02-26 (×3): qty 1

## 2019-02-26 MED ORDER — HYDROCHLOROTHIAZIDE 12.5 MG PO CAPS
12.5000 mg | ORAL_CAPSULE | Freq: Every day | ORAL | Status: DC
  Administered 2019-02-26: 12.5 mg via ORAL
  Filled 2019-02-26: qty 1

## 2019-02-26 MED ORDER — PANTOPRAZOLE SODIUM 40 MG PO TBEC
40.0000 mg | DELAYED_RELEASE_TABLET | Freq: Every day | ORAL | Status: DC
  Administered 2019-02-26 – 2019-02-27 (×2): 40 mg via ORAL
  Filled 2019-02-26 (×2): qty 1

## 2019-02-26 MED ORDER — INSULIN ASPART 100 UNIT/ML ~~LOC~~ SOLN
0.0000 [IU] | Freq: Three times a day (TID) | SUBCUTANEOUS | Status: DC
  Administered 2019-02-26 – 2019-02-27 (×3): 3 [IU] via SUBCUTANEOUS
  Administered 2019-02-27: 2 [IU] via SUBCUTANEOUS

## 2019-02-26 MED ORDER — TICAGRELOR 90 MG PO TABS
ORAL_TABLET | ORAL | Status: AC
  Filled 2019-02-26: qty 2

## 2019-02-26 MED ORDER — SODIUM CHLORIDE 0.9% FLUSH
3.0000 mL | INTRAVENOUS | Status: DC | PRN
  Administered 2019-02-26: 10 mL via INTRAVENOUS
  Filled 2019-02-26: qty 3

## 2019-02-26 MED ORDER — ONDANSETRON HCL 4 MG/2ML IJ SOLN: 4.0000 mg | Freq: Four times a day (QID) | INTRAMUSCULAR | Status: DC | PRN

## 2019-02-26 MED ORDER — TICAGRELOR 90 MG PO TABS
ORAL_TABLET | ORAL | Status: DC | PRN
  Administered 2019-02-26: 180 mg via ORAL

## 2019-02-26 MED ORDER — LIDOCAINE HCL (PF) 1 % IJ SOLN
INTRAMUSCULAR | Status: AC
  Filled 2019-02-26: qty 30

## 2019-02-26 MED ORDER — ENOXAPARIN SODIUM 40 MG/0.4ML ~~LOC~~ SOLN
40.0000 mg | SUBCUTANEOUS | Status: DC
  Administered 2019-02-26 – 2019-02-27 (×2): 40 mg via SUBCUTANEOUS
  Filled 2019-02-26 (×2): qty 0.4

## 2019-02-26 MED ORDER — SODIUM CHLORIDE 0.9% FLUSH
3.0000 mL | Freq: Two times a day (BID) | INTRAVENOUS | Status: DC
  Administered 2019-02-26: 3 mL via INTRAVENOUS
  Administered 2019-02-26: 10 mL via INTRAVENOUS

## 2019-02-26 MED ORDER — CHLORHEXIDINE GLUCONATE CLOTH 2 % EX PADS
6.0000 | MEDICATED_PAD | Freq: Every day | CUTANEOUS | Status: DC
  Administered 2019-02-26: 09:00:00 6 via TOPICAL

## 2019-02-26 MED ORDER — ATORVASTATIN CALCIUM 80 MG PO TABS
80.0000 mg | ORAL_TABLET | Freq: Every day | ORAL | Status: DC
  Administered 2019-02-26: 80 mg via ORAL
  Filled 2019-02-26: qty 1

## 2019-02-26 MED ORDER — NITROGLYCERIN 0.4 MG SL SUBL: 0.4000 mg | SUBLINGUAL_TABLET | SUBLINGUAL | Status: DC | PRN

## 2019-02-26 MED ORDER — METOPROLOL SUCCINATE ER 50 MG PO TB24
50.0000 mg | ORAL_TABLET | Freq: Every day | ORAL | Status: DC
  Administered 2019-02-26 – 2019-02-27 (×2): 50 mg via ORAL
  Filled 2019-02-26 (×2): qty 1

## 2019-02-26 MED ORDER — IOHEXOL 350 MG/ML SOLN
INTRAVENOUS | Status: DC | PRN
  Administered 2019-02-26: 02:00:00 155 mL

## 2019-02-26 MED ORDER — HEPARIN SODIUM (PORCINE) 1000 UNIT/ML IJ SOLN
INTRAMUSCULAR | Status: AC
  Filled 2019-02-26: qty 1

## 2019-02-26 MED ORDER — SODIUM CHLORIDE 0.9 % WEIGHT BASED INFUSION
1.0000 mL/kg/h | INTRAVENOUS | Status: AC
  Administered 2019-02-26: 1 mL/kg/h via INTRAVENOUS

## 2019-02-26 MED ORDER — VERAPAMIL HCL 2.5 MG/ML IV SOLN
INTRAVENOUS | Status: AC
  Filled 2019-02-26: qty 2

## 2019-02-26 MED ORDER — HEPARIN SODIUM (PORCINE) 5000 UNIT/ML IJ SOLN: 5000.0000 [IU] | Freq: Three times a day (TID) | INTRAMUSCULAR | Status: DC

## 2019-02-26 MED ORDER — TOPIRAMATE 25 MG PO TABS
75.0000 mg | ORAL_TABLET | Freq: Every day | ORAL | Status: DC
  Filled 2019-02-26 (×2): qty 3

## 2019-02-26 MED ORDER — ASPIRIN 81 MG PO CHEW
81.0000 mg | CHEWABLE_TABLET | Freq: Every day | ORAL | Status: DC
  Administered 2019-02-26 – 2019-02-27 (×2): 81 mg via ORAL
  Filled 2019-02-26 (×2): qty 1

## 2019-02-26 MED ORDER — NITROGLYCERIN 0.4 MG SL SUBL
SUBLINGUAL_TABLET | SUBLINGUAL | Status: AC
  Administered 2019-02-26: 0.4 mg via SUBLINGUAL
  Filled 2019-02-26: qty 1

## 2019-02-26 MED ORDER — LIDOCAINE HCL (PF) 1 % IJ SOLN
INTRAMUSCULAR | Status: DC | PRN
  Administered 2019-02-26: 2 mL

## 2019-02-26 MED ORDER — HEPARIN (PORCINE) IN NACL 1000-0.9 UT/500ML-% IV SOLN
INTRAVENOUS | Status: DC | PRN
  Administered 2019-02-26 (×3): 500 mL

## 2019-02-26 MED ORDER — SERTRALINE HCL 100 MG PO TABS
100.0000 mg | ORAL_TABLET | Freq: Every day | ORAL | Status: DC
  Administered 2019-02-26: 100 mg via ORAL
  Filled 2019-02-26: qty 1

## 2019-02-26 MED ORDER — VERAPAMIL HCL 2.5 MG/ML IV SOLN
INTRAVENOUS | Status: DC | PRN
  Administered 2019-02-26: 01:00:00 10 mL via INTRA_ARTERIAL

## 2019-02-26 MED ORDER — NITROGLYCERIN 1 MG/10 ML FOR IR/CATH LAB
INTRA_ARTERIAL | Status: DC | PRN
  Administered 2019-02-26: 200 ug via INTRACORONARY

## 2019-02-26 MED ORDER — HEPARIN (PORCINE) IN NACL 1000-0.9 UT/500ML-% IV SOLN
INTRAVENOUS | Status: AC
  Filled 2019-02-26: qty 1000

## 2019-02-26 MED ORDER — METOPROLOL TARTRATE 12.5 MG HALF TABLET: 12.5000 mg | ORAL_TABLET | Freq: Two times a day (BID) | ORAL | Status: DC

## 2019-02-26 MED ORDER — HEPARIN SODIUM (PORCINE) 1000 UNIT/ML IJ SOLN
4000.0000 [IU] | Freq: Once | INTRAMUSCULAR | Status: DC
  Filled 2019-02-26: qty 4

## 2019-02-26 MED ORDER — HEPARIN SODIUM (PORCINE) 1000 UNIT/ML IJ SOLN
INTRAMUSCULAR | Status: DC | PRN
  Administered 2019-02-26: 5000 [IU] via INTRAVENOUS

## 2019-02-26 MED ORDER — LISINOPRIL-HYDROCHLOROTHIAZIDE 20-12.5 MG PO TABS: 1.0000 | ORAL_TABLET | Freq: Every day | ORAL | Status: DC

## 2019-02-26 MED ORDER — SODIUM CHLORIDE 0.9 % IV SOLN: 250.0000 mL | INTRAVENOUS | Status: DC | PRN

## 2019-02-26 MED ORDER — HEART ATTACK BOUNCING BOOK
Freq: Once | Status: AC
  Administered 2019-02-26: 22:00:00
  Filled 2019-02-26: qty 1

## 2019-02-26 MED ORDER — NITROGLYCERIN 0.4 MG SL SUBL
0.4000 mg | SUBLINGUAL_TABLET | SUBLINGUAL | Status: DC | PRN
  Administered 2019-02-26: 0.4 mg via SUBLINGUAL

## 2019-02-26 MED ORDER — ACETAMINOPHEN 325 MG PO TABS: 650.0000 mg | ORAL_TABLET | ORAL | Status: DC | PRN

## 2019-02-26 MED ORDER — LISINOPRIL 20 MG PO TABS
20.0000 mg | ORAL_TABLET | Freq: Every day | ORAL | Status: DC
  Administered 2019-02-26: 20 mg via ORAL
  Filled 2019-02-26: qty 1

## 2019-02-26 SURGICAL SUPPLY — 19 items
BALLN SAPPHIRE 2.0X12 (BALLOONS) ×2
BALLN SAPPHIRE ~~LOC~~ 3.0X12 (BALLOONS) ×1 IMPLANT
BALLOON SAPPHIRE 2.0X12 (BALLOONS) IMPLANT
CATH 5FR JL3.5 JR4 ANG PIG MP (CATHETERS) ×1 IMPLANT
CATH LAUNCHER 6FR AL1 (CATHETERS) IMPLANT
CATH VISTA GUIDE 6FR 3DRC (CATHETERS) ×1 IMPLANT
CATHETER LAUNCHER 6FR AL1 (CATHETERS) ×2
DEVICE RAD COMP TR BAND LRG (VASCULAR PRODUCTS) ×1 IMPLANT
GLIDESHEATH SLEND SS 6F .021 (SHEATH) ×1 IMPLANT
GUIDEWIRE INQWIRE 1.5J.035X260 (WIRE) IMPLANT
INQWIRE 1.5J .035X260CM (WIRE) ×2
KIT ENCORE 26 ADVANTAGE (KITS) ×1 IMPLANT
KIT HEART LEFT (KITS) ×2 IMPLANT
PACK CARDIAC CATHETERIZATION (CUSTOM PROCEDURE TRAY) ×2 IMPLANT
STENT SYNERGY DES 2.5X16 (Permanent Stent) ×1 IMPLANT
SYR MEDRAD MARK 7 150ML (SYRINGE) ×2 IMPLANT
TRANSDUCER W/STOPCOCK (MISCELLANEOUS) ×2 IMPLANT
TUBING CIL FLEX 10 FLL-RA (TUBING) ×2 IMPLANT
WIRE ASAHI PROWATER 180CM (WIRE) ×1 IMPLANT

## 2019-02-26 NOTE — TOC Benefit Eligibility Note (Signed)
Transition of Care [facility]) Benefit Eligibility Note    Patient Details  Name: Timothy May MRN: [id number] Date of Birth: May 20, 1961   Medication/Dose: BRILINTA  90 MG  BID  Covered?: Yes  Tier: (PREFERRED BRAND   AND NO TIER)  Prescription Coverage Preferred Pharmacy: [facility] with Person/Company/Phone Number:: LISA  @ [employer] # [phone]  Co-Pay: $ [phone]  Prior Approval: No  Deductible: Met       [facility] Phone Number: 02/26/2019, 12:36 PM

## 2019-02-26 NOTE — Progress Notes (Signed)
  Echocardiogram 2D Echocardiogram has been performed.  Timothy May 02/26/2019, 2:25 PM

## 2019-02-26 NOTE — H&P (Signed)
History & Physical    Patient ID: Timothy GarinDavid A Fleissner MRN: [id number], DOB/AGE: 1961-02-26   Admit date: 02/25/2019   Primary Physician: Joycelyn RuaMeyers, Stephen, MD Primary Cardiologist: No primary care provider on file.  Patient Profile    Mr. Timothy May is a 58 year old gentleman with hypertension, hyperlipidemia, ongoing tobacco abuse who presents with chest pain.  Past Medical History    Past Medical History:  Diagnosis Date  . DDD (degenerative disc disease), lumbar    followed by Dr Newell CoralNudelman  . Gout   . Hypercholesteremia   . Hypertension   . Renal calculus or stone   . Tobacco abuse     Past Surgical History:  Procedure Laterality Date  . APPENDECTOMY     20 years ago     Allergies  No Known Allergies  History of Present Illness  Mr. Timothy May is a 58 year old gentleman with hypertension, hyperlipidemia, ongoing tobacco abuse who presents with chest pain.  The patient reports that he was in his usual state of health until the morning of 02/25/2019 when he developed substernal chest pain and tightness.  He reports that this came and went throughout the day and radiated to his left and sometimes his right arm as well as his jaw.  Was associated with shortness of breath but no nausea or diaphoresis.  Given ongoing symptoms and severe chest pain the patient came to the emergency room this evening.  He was given 325 of aspirin at home by his wife.  Notably the patient reports that prior to today he had never had chest pain.  He works in [employer] and is able to work without issue normally.  In the ED the patient was hemodynamically stable but with significantly elevated blood pressure.  His EKG showed borderline ST elevation in the anterior leads.  Given this as well as his intermittent chest pain recurring throughout the day a code STEMI was called.  He was treated with 4000 units of heparin in the ED and received 1 sublingual nitro for intermittent chest discomfort.  The patient was  then taken to the Cath Lab where he was found to have 100% proximal RCA lesion which was treated with DES x1.   Home Medications    Prior to Admission medications   Medication Sig Start Date End Date Taking? Authorizing Provider  atorvastatin (LIPITOR) 40 MG tablet Take 40 mg by mouth daily.    [provider]  clonazePAM (KLONOPIN) 0.5 MG tablet Take 0.5-1 mg by mouth 2 (two) times daily as needed for anxiety.     [provider]  ibuprofen (ADVIL,MOTRIN) 200 MG tablet Take 400-600 mg by mouth every 8 (eight) hours as needed (pain).     [provider]  lisinopril-hydrochlorothiazide (PRINZIDE,ZESTORETIC) 20-12.5 MG per tablet Take 1 tablet by mouth 2 (two) times daily.     [provider]  pantoprazole (PROTONIX) 40 MG tablet Take 1 tablet (40 mg total) by mouth daily. 07/25/14   Allred, Fayrene FearingJames, MD  sertraline (ZOLOFT) 100 MG tablet Take 1 tablet (100 mg total) by mouth daily. 09/02/14   Allred, Fayrene FearingJames, MD  topiramate (TOPAMAX) 25 MG tablet Take 75 mg by mouth at bedtime.    [provider]    Family History    Family History  Problem Relation Age of Onset  . Heart attack Father        father had multiple MIs, first MI age 4250s, multiple paternal family members with CAD in their 1050s  . Heart disease  Father   . Stroke Father   . Cancer Mother   . Cancer Sister    He indicated that his mother is deceased. He indicated that his father is deceased. He indicated that only one of his two sisters is alive.   Social History    Social History   Socioeconomic History  . Marital status: Married    Spouse name: Not on file  . Number of children: 1  . Years of education: HS  . Highest education level: Not on file  Occupational History  . Occupation: Barista  . Financial resource strain: Not on file  . Food insecurity    Worry: Not on file    Inability: Not on file  . Transportation needs    Medical: Not on file     Non-medical: Not on file  Tobacco Use  . Smoking status: Current Every Day Smoker    Packs/day: 1.00    Years: 20.00    Pack years: 20.00    Types: Cigarettes  . Smokeless tobacco: Never Used  Substance and Sexual Activity  . Alcohol use: Yes    Alcohol/week: 0.0 standard drinks    Comment: rare  . Drug use: No  . Sexual activity: Not on file  Lifestyle  . Physical activity    Days per week: Not on file    Minutes per session: Not on file  . Stress: Not on file  Relationships  . Social Herbalist on phone: Not on file    Gets together: Not on file    Attends religious service: Not on file    Active member of club or organization: Not on file    Attends meetings of clubs or organizations: Not on file    Relationship status: Not on file  . Intimate partner violence    Fear of current or ex partner: Not on file    Emotionally abused: Not on file    Physically abused: Not on file    Forced sexual activity: Not on file  Other Topics Concern  . Not on file  Social History Narrative   Lives in [place].  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   Patient is right handed.   Patient drinks 3- cups caffeine daily.     Review of Systems    General:  No chills, fever, night sweats or weight changes.  Cardiovascular: As per HPI Dermatological: No rash, lesions/masses Respiratory: As per HPI Urologic: No hematuria, dysuria Abdominal:   No nausea, vomiting, diarrhea, bright red blood per rectum, melena, or hematemesis Neurologic:  No visual changes, wkns, changes in mental status. All other systems reviewed and are otherwise negative except as noted above.  Physical Exam    Blood pressure (!) 158/78, pulse 90, temperature 97.9 F (36.6 C), temperature source Oral, resp. rate 17, height 6' (1.829 m), weight 104.3 kg, SpO2 100 %.  General: Pleasant, appears intermittently uncomfortable with chest pain  Neuro: Alert and oriented X 3. Moves all extremities spontaneously. HEENT: Normal  Neck: Supple without bruits or JVD. Lungs:  Resp regular and unlabored, CTA. Heart: RRR no s3, s4, or murmurs. Abdomen: Soft, non-tender, non-distended, BS + x 4.  Extremities: No clubbing, cyanosis or edema. DP/PT/Radials 2+ and equal bilaterally.  Labs    Cardiac Enzymes Recent Labs  Lab 02/25/19 2337  TROPONINIHS 40*      Lab Results  Component Value Date  WBC 6.8 02/25/2019   HGB 14.7 02/25/2019   HCT 46.4 02/25/2019   MCV 86.1 02/25/2019   PLT 143 (L) 02/25/2019    Recent Labs  Lab 02/25/19 2337  NA 134*  K 3.5  CL 102  CO2 20*  BUN 16  CREATININE 0.97  CALCIUM 8.7*  GLUCOSE 276*   Lab Results  Component Value Date   CHOL 247 (H) 02/25/2019   HDL 34 (L) 02/25/2019   LDLCALC UNABLE TO CALCULATE IF TRIGLYCERIDE OVER 400 mg/dL 15/72/6203   TRIG 559 (H) 02/25/2019   Lab Results  Component Value Date   DDIMER 0.54 (H) 07/16/2014     Radiology Studies    Dg Chest Port 1 View  Result Date: 02/26/2019 CLINICAL DATA:  Chest pain EXAM: PORTABLE CHEST 1 VIEW COMPARISON:  July 16, 2014 FINDINGS: The heart size and mediastinal contours are within normal limits. Both lungs are clear. The visualized skeletal structures are unremarkable. IMPRESSION: No acute cardiopulmonary process. Electronically Signed   By: Jonna Clark M.D.   On: 02/26/2019 00:31    ECG & Cardiac Imaging    02/26/2019- personally reviewed.  Sinus rhythm.  Borderline 1 to 2 mm ST elevation in V1 through V3 without reciprocal changes  Assessment & Plan    Mr. Rooke is a 58 year old gentleman with hypertension, hyperlipidemia, ongoing tobacco abuse who presents with chest pain and borderline ECG, found to have 100% proximal RCA occlusion.  # STEMI Patient is now status post DES x1 placement to his proximal RCA.  - ASA 81 mg daily - Ticagrelor 180mg  in the cath lab and will start 90mg  BID - atorvastatin  80mg  QHS, increased from home 40mg  - Start metoprolol 12.5mg  BID - TTE in the AM - Tobacco cessation counseling - Cardiac rehab  # Dyslipidemia; severely elevated triglyceride levels - Atorvastatin as above  # HTN - cont home HCTZ/lisinopril, decrease to once daily while adding BB - Metoprolol as above  # Depression - Cont sertraline, topiramate  # GERD - Cont protonix  # Heart healthy diet  # FULL CODE  Signed, Starlyn Skeans, MD 02/26/2019, 1:05 AM

## 2019-02-26 NOTE — Progress Notes (Signed)
Clarification - on Brilinta copay- $95.23

## 2019-02-26 NOTE — Progress Notes (Signed)
Progress Note  Patient Name: Lafe GarinDavid A Trice Date of Encounter: 02/26/2019  Primary Cardiologist: No primary care provider on file.   Subjective   Feels better today. Chest pain resolved. No dyspnea.  Inpatient Medications    Scheduled Meds: . aspirin  81 mg Oral Daily  . atorvastatin  80 mg Oral q1800  . Chlorhexidine Gluconate Cloth  6 each Topical Daily  . enoxaparin (LOVENOX) injection  40 mg Subcutaneous Q24H  . lisinopril  20 mg Oral Daily   And  . hydrochlorothiazide  12.5 mg Oral Daily  . insulin aspart  0-15 Units Subcutaneous TID WC  . metoprolol succinate  50 mg Oral Daily  . pantoprazole  40 mg Oral Daily  . sertraline  100 mg Oral Daily  . sodium chloride flush  3 mL Intravenous Q12H  . ticagrelor  90 mg Oral BID  . topiramate  75 mg Oral QHS   Continuous Infusions: . sodium chloride 10 mL/hr at 02/26/19 0009  . sodium chloride    . sodium chloride 1 mL/kg/hr (02/26/19 0203)   PRN Meds: sodium chloride, acetaminophen, nitroGLYCERIN, ondansetron (ZOFRAN) IV, sodium chloride flush   Vital Signs    Vitals:   02/26/19 0600 02/26/19 0615 02/26/19 0630 02/26/19 0645  BP: (!) 157/89 (!) 146/104 (!) 148/89 (!) 162/87  Pulse: (!) 58 (!) 57 (!) 58 (!) 53  Resp: (!) 8 11 14 12   Temp:      TempSrc:      SpO2: 97% 97% 98% 98%  Weight:      Height:        Intake/Output Summary (Last 24 hours) at 02/26/2019 0749 Last data filed at 02/26/2019 0700 Gross per 24 hour  Intake 584.79 ml  Output 575 ml  Net 9.79 ml   Last 3 Weights 02/26/2019 02/25/2019 09/02/2014  Weight (lbs) 246 lb 7.6 oz 230 lb 241 lb 12.8 oz  Weight (kg) 111.8 kg 104.327 kg 109.68 kg      Telemetry    NSR, multiple runs of NSVT up to 14 beats.  - Personally Reviewed  ECG    NSR with PVC. ST elevation resolved. - Personally Reviewed  Physical Exam   GEN: No acute distress.   Neck: No JVD Cardiac: RRR, no murmurs, rubs, or gallops.  Respiratory: Clear to auscultation bilaterally. GI:  Soft, nontender, non-distended  MS: No edema; No deformity. Right radial site without hematoma Neuro:  Nonfocal  Psych: Normal affect   Labs    High Sensitivity Troponin:   Recent Labs  Lab 02/25/19 2337 02/26/19 0222  TROPONINIHS 40* 361*      Chemistry Recent Labs  Lab 02/25/19 2337 02/26/19 0222  NA 134* 135  K 3.5 3.8  CL 102 104  CO2 20* 20*  GLUCOSE 276* 150*  BUN 16 17  CREATININE 0.97 0.83  CALCIUM 8.7* 8.5*  GFRNONAA >60 >60  GFRAA >60 >60  ANIONGAP 12 11     Hematology Recent Labs  Lab 02/25/19 2337 02/26/19 0222  WBC 6.8 7.1  RBC 5.39 5.03  HGB 14.7 13.6  HCT 46.4 42.1  MCV 86.1 83.7  MCH 27.3 27.0  MCHC 31.7 32.3  RDW 14.6 14.5  PLT 143* 127*    BNP Recent Labs  Lab 02/26/19 0222  BNP 28.7     DDimer No results for input(s): DDIMER in the last 168 hours.   Radiology    Dg Chest Port 1 View  Result Date: 02/26/2019 CLINICAL DATA:  Chest pain  EXAM: PORTABLE CHEST 1 VIEW COMPARISON:  July 16, 2014 FINDINGS: The heart size and mediastinal contours are within normal limits. Both lungs are clear. The visualized skeletal structures are unremarkable. IMPRESSION: No acute cardiopulmonary process. Electronically Signed   By: Prudencio Pair M.D.   On: 02/26/2019 00:31    Cardiac Studies   Coronary/Graft Acute MI Revascularization  CORONARY STENT INTERVENTION  LEFT HEART CATH AND CORONARY ANGIOGRAPHY  Conclusion    Prox LAD to Mid LAD lesion is 20% stenosed.  2nd Mrg lesion is 40% stenosed.  Dist Cx lesion is 40% stenosed with 40% stenosed side branch in LPAV.  Prox RCA lesion is 100% stenosed.  Post intervention, there is a 0% residual stenosis.  A drug-eluting stent was successfully placed using a STENT SYNERGY DES 2.5X16.  The left ventricular systolic function is normal.  LV end diastolic pressure is normal.  The left ventricular ejection fraction is 55-65% by visual estimate.   1. 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 Normal LV function 3. Normal LVEDP 4. Successful PCI of the RCA with DES x 1  Plan: DAPT for one year. Patient is a candidate for fast track DC.       Patient Profile     58 y.o. male with history of HTN, HLD, tobacco abuse presents with STEMI  Assessment & Plan    1. STEMI involving nondominant RCA. S/p emergent reperfusion with DES. On DAPT for one year. Angina resolved. Ecg improved. HS troponin up to 361. LV function looked good on cath. Echo pending. 2. HTN. Poorly controlled. Resume lisinopril HCT. Add Toprol XL 50 mg daily 3, HLD now on high dose lipitor. Triglycerides are high. Nonfasting. May be influenced by DM 4. DM not previously diagnosed. A1c 7%. Discussed low carb diet. May need to go on metformin at discharge. On SSI 5. Tobacco abuse counseled on smoking cessation 6. NSVT- post reperfusion. [employer]. Start Toprol and monitor.  Plan: increase activity today. Depending on clinical course he may be a candidate for DC tomorrow.   For questions or updates, please contact [place] Please consult [email or web address] for contact info under        Signed,  [place], MD  02/26/2019, 7:49 AM

## 2019-02-26 NOTE — ED Notes (Signed)
Pt updated his wife on plan at this time

## 2019-02-26 NOTE — ED Provider Notes (Signed)
[facility] EMERGENCY DEPARTMENT Provider Note   CSN: [id number] Arrival date & time: 02/25/19  2304     History   Chief Complaint Chief Complaint  Patient presents with  . Chest Pain    HPI Timothy May is a 58 y.o. male.     Patient presents with chest pain has been intermittent all day.  States he developed central chest pain this morning while working in his garden.  Was in the center of his chest and radiated to his arm and his neck associate with some shortness of breath.  He says that his pain resolved after about 5 minutes prior to about 7 PM with her pain is been intermittent since.  He describes a center of the chest pain that goes to his arm and his neck with shortness of breath.  The pain lasts for several minutes at a time.  He took some aspirin at home with some improvement.  He denies any pain currently.  There is been no cough or fever.  No leg pain or leg swelling.  History of hypertension, hyperlipidemia tobacco abuse.  No known CAD.  Reports negative stress test in 2016. He is not had this kind of pain before.  The history is provided by the patient. The history is limited by the condition of the patient.    Past Medical History:  Diagnosis Date  . DDD (degenerative disc disease), lumbar    followed by Dr Sherwood Gambler  . Gout   . Hypercholesteremia   . Hypertension   . Renal calculus or stone   . Tobacco abuse     Patient Active Problem List   Diagnosis Date Noted  . Dizziness 07/25/2014  . Depression 07/25/2014  . Chest pain 07/16/2014  . Generalized anxiety disorder 07/16/2014  . Essential hypertension 07/16/2014  . Hyperlipidemia 07/16/2014  . Tobacco abuse 07/16/2014  . Abnormal CT scan, chest 07/16/2014    Past Surgical History:  Procedure Laterality Date  . APPENDECTOMY     20 years ago        Home Medications    Prior to Admission medications   Medication Sig Start Date End Date Taking? Authorizing Provider   atorvastatin (LIPITOR) 40 MG tablet Take 40 mg by mouth daily.    [provider]  clonazePAM (KLONOPIN) 0.5 MG tablet Take 0.5-1 mg by mouth 2 (two) times daily as needed for anxiety.     [provider]  ibuprofen (ADVIL,MOTRIN) 200 MG tablet Take 400-600 mg by mouth every 8 (eight) hours as needed (pain).     [provider]  lisinopril-hydrochlorothiazide (PRINZIDE,ZESTORETIC) 20-12.5 MG per tablet Take 1 tablet by mouth 2 (two) times daily.     [provider]  pantoprazole (PROTONIX) 40 MG tablet Take 1 tablet (40 mg total) by mouth daily. 07/25/14   Allred, Jeneen Rinks, MD  sertraline (ZOLOFT) 100 MG tablet Take 1 tablet (100 mg total) by mouth daily. 09/02/14   Allred, Jeneen Rinks, MD  topiramate (TOPAMAX) 25 MG tablet Take 75 mg by mouth at bedtime.    [provider]    Family History Family History  Problem Relation Age of Onset  . Heart attack Father        father had multiple MIs, first MI age 70s, multiple paternal family members with CAD in their 69s  . Heart disease Father   . Stroke Father   . Cancer Mother   . Cancer Sister     Social History Social History  Tobacco Use  . Smoking status: Current Every Day Smoker    Packs/day: 1.00    Years: 20.00    Pack years: 20.00    Types: Cigarettes  . Smokeless tobacco: Never Used  Substance Use Topics  . Alcohol use: Yes    Alcohol/week: 0.0 standard drinks    Comment: rare  . Drug use: No     Allergies   Patient has no known allergies.   Review of Systems Review of Systems  Constitutional: Negative for activity change, appetite change and fever.  HENT: Negative for congestion and rhinorrhea.   Eyes: Negative for visual disturbance.  Respiratory: Positive for chest tightness and shortness of breath.   Cardiovascular: Positive for chest pain.  Gastrointestinal: Negative for abdominal pain, nausea and vomiting.  Genitourinary: Negative for dysuria and hematuria.   Musculoskeletal: Negative for arthralgias and myalgias.  Skin: Negative for rash.  Neurological: Negative for dizziness, weakness and headaches.    all other systems are negative except as noted in the HPI and PMH.    Physical Exam Updated Vital Signs BP (!) 188/99 (BP Location: Left Arm)   Pulse 81   Temp 97.9 F (36.6 C) (Oral)   Resp 18   Ht 6' (1.829 m)   Wt 104.3 kg   SpO2 97%   BMI 31.19 kg/m   Physical Exam Vitals signs and nursing note reviewed.  Constitutional:      General: He is not in acute distress.    Appearance: He is well-developed. He is obese.  HENT:     Head: Normocephalic and atraumatic.     Mouth/Throat:     Pharynx: No oropharyngeal exudate.  Eyes:     Conjunctiva/sclera: Conjunctivae normal.     Pupils: Pupils are equal, round, and reactive to light.  Neck:     Musculoskeletal: Normal range of motion and neck supple.     Comments: No meningismus. Cardiovascular:     Rate and Rhythm: Normal rate and regular rhythm.     Heart sounds: Normal heart sounds. No murmur.     Comments: Equal femoral, DP and PT pulses Pulmonary:     Effort: Pulmonary effort is normal. No respiratory distress.     Breath sounds: Normal breath sounds.  Chest:     Chest wall: No tenderness.  Abdominal:     Palpations: Abdomen is soft.     Tenderness: There is no abdominal tenderness. There is no guarding or rebound.  Musculoskeletal: Normal range of motion.        General: No tenderness.  Skin:    General: Skin is warm.     Capillary Refill: Capillary refill takes less than 2 seconds.  Neurological:     General: No focal deficit present.     Mental Status: He is alert and oriented to person, place, and time. Mental status is at baseline.     Cranial Nerves: No cranial nerve deficit.     Motor: No abnormal muscle tone.     Coordination: Coordination normal.     Comments: No ataxia on finger to nose bilaterally. No pronator drift. 5/5 strength throughout. CN 2-12  intact.Equal grip strength. Sensation intact.   Psychiatric:        Behavior: Behavior normal.      ED Treatments / Results  Labs (all labs ordered are listed, but only abnormal results are displayed) Labs Reviewed  BASIC METABOLIC PANEL - Abnormal; Notable for the following components:      Result Value  Sodium 134 (*)    CO2 20 (*)    Glucose, Bld 276 (*)    Calcium 8.7 (*)    All other components within normal limits  CBC - Abnormal; Notable for the following components:   Platelets 143 (*)    All other components within normal limits  LIPID PANEL - Abnormal; Notable for the following components:   Cholesterol 247 (*)    Triglycerides 718 (*)    HDL 34 (*)    All other components within normal limits  LDL CHOLESTEROL, DIRECT - Abnormal; Notable for the following components:   Direct LDL 146.7 (*)    All other components within normal limits  HEMOGLOBIN A1C - Abnormal; Notable for the following components:   Hgb A1c MFr Bld 7.0 (*)    All other components within normal limits  BASIC METABOLIC PANEL - Abnormal; Notable for the following components:   CO2 20 (*)    Glucose, Bld 150 (*)    Calcium 8.5 (*)    All other components within normal limits  CBC - Abnormal; Notable for the following components:   Platelets 127 (*)    All other components within normal limits  TROPONIN I (HIGH SENSITIVITY) - Abnormal; Notable for the following components:   Troponin I (High Sensitivity) 40 (*)    All other components within normal limits  TROPONIN I (HIGH SENSITIVITY) - Abnormal; Notable for the following components:   Troponin I (High Sensitivity) 361 (*)    All other components within normal limits  SARS CORONAVIRUS 2 (HOSPITAL ORDER, PERFORMED IN [facility])  MRSA PCR SCREENING  PROTIME-INR  APTT  BRAIN NATRIURETIC PEPTIDE  HIV ANTIBODY (ROUTINE TESTING W REFLEX)  TSH  T4, FREE    EKG EKG Interpretation  Date/Time:  Sunday February 25 2019 23:33:02  EDT Ventricular Rate:  80 PR Interval:  170 QRS Duration: 96 QT Interval:  402 QTC Calculation: 463 R Axis:   83 Text Interpretation:  Normal sinus rhythm Incomplete right bundle branch block Nonspecific ST abnormality Abnormal ECG anterior J elevation and concave up ST elevation  Confirmed by Glynn Octave [id number]) on 02/26/2019 12:11:22 AM   Radiology Dg Chest Port 1 View  Result Date: 02/26/2019 CLINICAL DATA:  Chest pain EXAM: PORTABLE CHEST 1 VIEW COMPARISON:  July 16, 2014 FINDINGS: The heart size and mediastinal contours are within normal limits. Both lungs are clear. The visualized skeletal structures are unremarkable. IMPRESSION: No acute cardiopulmonary process. Electronically Signed   By: Jonna Clark M.D.   On: 02/26/2019 00:31    Procedures Procedures (including critical care time)  Medications Ordered in ED Medications  sodium chloride flush (NS) 0.9 % injection 3 mL (has no administration in time range)  aspirin chewable tablet 324 mg (has no administration in time range)  0.9 %  sodium chloride infusion (has no administration in time range)  heparin 5000 UNIT/ML injection (has no administration in time range)  aspirin 81 MG chewable tablet (has no administration in time range)     Initial Impression / Assessment and Plan / ED Course  I have reviewed the triage vital signs and the nursing notes.  Pertinent labs & imaging results that were available during my care of the patient were reviewed by me and considered in my medical decision making (see chart for details).       Patient with intermittent chest pain all day lasting for several minutes at a time associated shortness of breath.  His EKG does  show concave up ST elevation and J-point elevation in V1 through V3.  He is not having chest pain currently.  He is given aspirin.  EKG discussed with Dr. Swaz[place] of interventional cardiology.  He prefers to take patient to the Cath Lab at this time given his EKG  changes and ongoing symptoms even though he is chest pain-free now.  Code STEMI activated.  Patient given aspirin and heparin bolus. D/w Dr. [place].  Mental status and blood pressure remain stable throughout ED course.   CRITICAL CARE Performed by: Glynn OctaveStephen Birdia Jaycox Total critical care time: 35 minutes Critical care time was exclusive of separately billable procedures and treating other patients. Critical care was necessary to treat or prevent imminent or life-threatening deterioration. Critical care was time spent personally by me on the following activities: development of treatment plan with patient and/or surrogate as well as nursing, discussions with consultants, evaluation of patient's response to treatment, examination of patient, obtaining history from patient or surrogate, ordering and performing treatments and interventions, ordering and review of laboratory studies, ordering and review of radiographic studies, pulse oximetry and re-evaluation of patient's condition.    Final Clinical Impressions(s) / ED Diagnoses   Final diagnoses:  ST elevation myocardial infarction (STEMI), unspecified artery [facility](HCC)    ED Discharge Orders    None       Glynn Octaveancour, Cotina Freedman, MD 02/26/19 [phone]

## 2019-02-26 NOTE — Plan of Care (Signed)
  Problem: Education: Goal: Understanding of cardiac disease, CV risk reduction, and recovery process will improve Outcome: Progressing Goal: Understanding of medication regimen will improve Outcome: Progressing Goal: Individualized Educational Video(s) Outcome: Progressing   Problem: Activity: Goal: Ability to tolerate increased activity will improve Outcome: Progressing   Problem: Cardiac: Goal: Ability to achieve and maintain adequate cardiopulmonary perfusion will improve Outcome: Completed/Met Goal: Vascular access site(s) Level 0-1 will be maintained Outcome: Progressing   Problem: Health Behavior/Discharge Planning: Goal: Ability to safely manage health-related needs after discharge will improve Outcome: Progressing   Problem: Education: Goal: Knowledge of General Education information will improve Description: Including pain rating scale, medication(s)/side effects and non-pharmacologic comfort measures Outcome: Progressing   Problem: Health Behavior/Discharge Planning: Goal: Ability to manage health-related needs will improve Outcome: Progressing   Problem: Activity: Goal: Risk for activity intolerance will decrease Outcome: Completed/Met   Problem: Nutrition: Goal: Adequate nutrition will be maintained Outcome: Completed/Met   Problem: Coping: Goal: Level of anxiety will decrease Outcome: Completed/Met   Problem: Elimination: Goal: Will not experience complications related to bowel motility Outcome: Completed/Met Goal: Will not experience complications related to urinary retention Outcome: Completed/Met   Problem: Pain Managment: Goal: General experience of comfort will improve Outcome: Completed/Met   Problem: Safety: Goal: Ability to remain free from injury will improve Outcome: Progressing   Problem: Skin Integrity: Goal: Risk for impaired skin integrity will decrease Outcome: Progressing

## 2019-02-26 NOTE — ED Notes (Signed)
Pt says his wife gave him 2 aspirin at home before he came here "not the baby aspirin ones"

## 2019-02-27 ENCOUNTER — Encounter (HOSPITAL_COMMUNITY): Payer: Self-pay | Admitting: Cardiology

## 2019-02-27 DIAGNOSIS — I10 Essential (primary) hypertension: Secondary | ICD-10-CM

## 2019-02-27 DIAGNOSIS — I2119 ST elevation (STEMI) myocardial infarction involving other coronary artery of inferior wall: Principal | ICD-10-CM

## 2019-02-27 DIAGNOSIS — E118 Type 2 diabetes mellitus with unspecified complications: Secondary | ICD-10-CM

## 2019-02-27 HISTORY — DX: Type 2 diabetes mellitus with unspecified complications: E11.8

## 2019-02-27 LAB — GLUCOSE, CAPILLARY
Glucose-Capillary: 159 mg/dL — ABNORMAL HIGH (ref 70–99)
Glucose-Capillary: 122 mg/dL — ABNORMAL HIGH (ref 70–99)

## 2019-02-27 LAB — HIV ANTIBODY (ROUTINE TESTING W REFLEX): HIV Screen 4th Generation wRfx: NONREACTIVE

## 2019-02-27 MED ORDER — NITROGLYCERIN 0.4 MG SL SUBL: 0.4000 mg | SUBLINGUAL_TABLET | SUBLINGUAL | 2 refills | Status: DC | PRN

## 2019-02-27 MED ORDER — TICAGRELOR 90 MG PO TABS: 90.0000 mg | ORAL_TABLET | Freq: Two times a day (BID) | ORAL | 2 refills | Status: DC

## 2019-02-27 MED ORDER — ATORVASTATIN CALCIUM 80 MG PO TABS: 80.0000 mg | ORAL_TABLET | Freq: Every day | ORAL | 1 refills | Status: DC

## 2019-02-27 MED ORDER — METFORMIN HCL 500 MG PO TABS: 500.0000 mg | ORAL_TABLET | Freq: Two times a day (BID) | ORAL | 1 refills | Status: DC

## 2019-02-27 MED ORDER — METOPROLOL SUCCINATE ER 50 MG PO TB24: 50.0000 mg | ORAL_TABLET | Freq: Every day | ORAL | 1 refills | Status: DC

## 2019-02-27 MED ORDER — ASPIRIN 81 MG PO CHEW: 81.0000 mg | CHEWABLE_TABLET | Freq: Every day | ORAL | 1 refills | Status: DC

## 2019-02-27 MED ORDER — METFORMIN HCL 500 MG PO TABS: 500.0000 mg | ORAL_TABLET | Freq: Two times a day (BID) | ORAL | Status: DC

## 2019-02-27 MED ORDER — AMLODIPINE BESYLATE 10 MG PO TABS
10.0000 mg | ORAL_TABLET | Freq: Every day | ORAL | Status: DC
  Administered 2019-02-27: 10 mg via ORAL
  Filled 2019-02-27: qty 1

## 2019-02-27 MED ORDER — BLOOD GLUCOSE METER KIT: PACK | 0 refills | Status: AC

## 2019-02-27 MED ORDER — ENOXAPARIN SODIUM 60 MG/0.6ML ~~LOC~~ SOLN: 55.0000 mg | SUBCUTANEOUS | Status: DC

## 2019-02-27 MED FILL — METOPROLOL SUCCINATE ER 50: 50 | 90 days supply | Qty: 90 | Fill #0

## 2019-02-27 MED FILL — metFORMIN HCL 500 MG TABS: 500 | 30 days supply | Qty: 60 | Fill #0

## 2019-02-27 MED FILL — ASPIRIN LOW DOSE 81 MG CHEW: 81 | 90 days supply | Qty: 90 | Fill #0

## 2019-02-27 MED FILL — FREESTYLE LITE TEST STRIP: 25 days supply | Qty: 100 | Fill #0

## 2019-02-27 MED FILL — FREESTYLE LITE METER: 1 days supply | Qty: 1 | Fill #0

## 2019-02-27 MED FILL — BRILINTA 90 MG TABLET: 90 | 30 days supply | Qty: 60 | Fill #0

## 2019-02-27 MED FILL — FREESTYLE LANCETS: 25 days supply | Qty: 100 | Fill #0

## 2019-02-27 MED FILL — ATORVASTATIN CALCIUM 80 MG: 80 | 90 days supply | Qty: 90 | Fill #0

## 2019-02-27 MED FILL — NITROGLYCERIN 0.4 MG TAB SL: 0.4 | 8 days supply | Qty: 25 | Fill #0

## 2019-02-27 NOTE — Discharge Summary (Signed)
Discharge Summary    Patient ID: Timothy May,  MRN: [id number], DOB/AGE: 23-Oct-1960 58 y.o.  Admit date: 02/25/2019 Discharge date: 02/27/2019  Primary Care Provider: Orpah Melter Primary Cardiologist: Peter Martinique, MD  Discharge Diagnoses    Principal Problem:   STEMI (ST elevation myocardial infarction) [facility]) Active Problems:   Chest pain   Essential hypertension   Hyperlipidemia   Tobacco abuse   Type 2 diabetes mellitus with complication, without long-term current use of insulin (HCC)   Allergies No Known Allergies  Diagnostic Studies/Procedures    Cath: 02/26/19   Prox LAD to Mid LAD lesion is 20% stenosed.  2nd Mrg lesion is 40% stenosed.  Dist Cx lesion is 40% stenosed with 40% stenosed side branch in LPAV.  Prox RCA lesion is 100% stenosed.  Post intervention, there is a 0% residual stenosis.  A drug-eluting stent was successfully placed using a STENT SYNERGY DES 2.5X16.  The left ventricular systolic function is normal.  LV end diastolic pressure is normal.  The left ventricular ejection fraction is 55-65% by visual estimate.   1. 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 Normal LV function 3. Normal LVEDP 4. Successful PCI of the RCA with DES x 1  Plan: DAPT for one year. Patient is a candidate for fast track DC.   Diagnostic Dominance: Left  Intervention     TTE: 02/26/19   IMPRESSIONS    1. The left ventricle has normal systolic function, with an ejection fraction of 55-60%. The cavity size was normal. There is moderately increased left ventricular wall thickness. Left ventricular diastolic Doppler parameters are consistent with  pseudonormalization.  2. There is mild mitral annular calcification present.  3. The aortic valve is tricuspid. Mild sclerosis of the aortic valve.  4. The aorta is normal unless otherwise noted.  5. The inferior vena cava was dilated in size with >50% respiratory variability.   _____________   History of Present Illness     Timothy May is a 58 year old gentleman with hypertension, hyperlipidemia, ongoing tobacco abuse who presented with chest pain.  The patient reported that he was in his usual state of health until the morning of 02/25/2019 when he developed substernal chest pain and tightness.  He reported that this came and went throughout the day and radiated to his left and sometimes his right arm as well as his jaw.  Was associated with shortness of breath but no nausea or diaphoresis.  Given ongoing symptoms and severe chest pain the patient came to the emergency room this evening.  He was given 325 of aspirin at home by his wife.  Notably the patient reported that prior to that day he had never had chest pain.  He worked in [employer] and was able to work without issue normally.  In the ED the patient was hemodynamically stable but with significantly elevated blood pressure.  His EKG showed borderline ST elevation in the anterior leads.  Given this as well as his intermittent chest pain recurring throughout the day a code STEMI was called.  He was treated with 4000 units of heparin in the ED and received 1 sublingual nitro for intermittent chest discomfort.  The patient was then taken to the Cath Lab.   Hospital Course     Underwent cardiac cath noted above with where he was found to have 100% proximal RCA lesion which was treated with DES x1. Placed on DAPT with ASA/Brilinta for at least one year. HsT peaked  at 361. Unable to calculate LDL as Trig elevated at 718. Placed on high dose statin. Hgb A1c 7, therefore will plan to start metformin 519m daily at the time of discharge. Resumed on amlodipine (held home lisinopril at discharge) and added Toprol XL 536mdaily. Tolerated well. Follow up echo showed EF of 55-60% with moderate LVH. Worked well with cardiac rehab, without recurrent chest pain. Seen by the diabetes educator during admission. Given a Rx for glucose  meter, strips and lancets at discharge.   DaBeckie Busingas seen by Dr. McAngelena Formnd determined stable for discharge home. Follow up in the office has been arranged. Medications are listed below.   _____________  Discharge Vitals Blood pressure (!) 141/85, pulse (!) 56, temperature (!) 97.5 F (36.4 C), temperature source Oral, resp. rate 16, height 6' (1.829 m), weight 111.8 kg, SpO2 98 %.  Filed Weights   02/25/19 2342 02/26/19 0215  Weight: 104.3 kg 111.8 kg    Labs & Radiologic Studies    CBC Recent Labs    02/25/19 2337 02/26/19 0222  WBC 6.8 7.1  HGB 14.7 13.6  HCT 46.4 42.1  MCV 86.1 83.7  PLT 143* 12768  Basic Metabolic Panel Recent Labs    02/25/19 2337 02/26/19 0222  NA 134* 135  K 3.5 3.8  CL 102 104  CO2 20* 20*  GLUCOSE 276* 150*  BUN 16 17  CREATININE 0.97 0.83  CALCIUM 8.7* 8.5*   Liver Function Tests No results for input(s): AST, ALT, ALKPHOS, BILITOT, PROT, ALBUMIN in the last 72 hours. No results for input(s): LIPASE, AMYLASE in the last 72 hours. Cardiac Enzymes No results for input(s): CKTOTAL, CKMB, CKMBINDEX, TROPONINI in the last 72 hours. BNP Invalid input(s): POCBNP D-Dimer No results for input(s): DDIMER in the last 72 hours. Hemoglobin A1C Recent Labs    02/26/19 0222  HGBA1C 7.0*   Fasting Lipid Panel Recent Labs    02/25/19 2337  CHOL 247*  HDL 34*  LDLCALC UNABLE TO CALCULATE IF TRIGLYCERIDE OVER 400 mg/dL  TRIG 718*  CHOLHDL 7.3  LDLDIRECT 146.7*   Thyroid Function Tests Recent Labs    02/26/19 0222  TSH 2.862   _____________  Dg Chest Port 1 View  Result Date: 02/26/2019 CLINICAL DATA:  Chest pain EXAM: PORTABLE CHEST 1 VIEW COMPARISON:  July 16, 2014 FINDINGS: The heart size and mediastinal contours are within normal limits. Both lungs are clear. The visualized skeletal structures are unremarkable. IMPRESSION: No acute cardiopulmonary process. Electronically Signed   By: BiPrudencio Pair.D.   On:  02/26/2019 00:31   Disposition   Pt is being discharged home today in good condition.  Follow-up Plans & Appointments    Follow-up Information    [facility] [facility] on 03/06/2019.   Specialties: Cardiology, Radiology Why: at 10:15am for your follow up appt.  Contact information: [id number]        Discharge Instructions    Amb Referral to Cardiac Rehabilitation   Complete by: As directed    Diagnosis:  Coronary Stents STEMI PTCA     After initial evaluation and assessments completed: Virtual Based Care may be provided alone or in conjunction with Phase 2 Cardiac Rehab based on patient barriers.: Yes   Call MD for:  redness, tenderness, or signs of infection (pain, swelling, redness, odor or green/yellow discharge around incision site)   Complete by: As directed    Diet -  low sodium heart healthy   Complete by: As directed    Discharge instructions   Complete by: As directed    Radial Site Care Refer to this sheet in the next few weeks. These instructions provide you with information on caring for yourself after your procedure. Your caregiver may also give you more specific instructions. Your treatment has been planned according to current medical practices, but problems sometimes occur. Call your caregiver if you have any problems or questions after your procedure. HOME CARE INSTRUCTIONS You may shower the day after the procedure.Remove the bandage (dressing) and gently wash the site with plain soap and water.Gently pat the site dry.  Do not apply powder or lotion to the site.  Do not submerge the affected site in water for 3 to 5 days.  Inspect the site at least twice daily.  Do not flex or bend the affected arm for 24 hours.  No lifting over 5 pounds (2.3 kg) for 5 days after your procedure. .  What to expect: Any bruising will usually fade within 1 to 2 weeks.  Blood that collects in the tissue (hematoma)  may be painful to the touch. It should usually decrease in size and tenderness within 1 to 2 weeks.  SEEK IMMEDIATE MEDICAL CARE IF: You have unusual pain at the radial site.  You have redness, warmth, swelling, or pain at the radial site.  You have drainage (other than a small amount of blood on the dressing).  You have chills.  You have a fever or persistent symptoms for more than 72 hours.  You have a fever and your symptoms suddenly get worse.  Your arm becomes pale, cool, tingly, or numb.  You have heavy bleeding from the site. Hold pressure on the site.   No driving for 2 weeks. No lifting over 10 lbs for 4 weeks. No sexual activity for 4 weeks. You may not return to work until cleared by your cardiologist. Keep procedure site clean & dry. If you notice increased pain, swelling, bleeding or pus, call/return!  You may shower, but no soaking baths/hot tubs/pools for 1 week.   PLEASE DO NOT MISS ANY DOSES OF YOUR BRILINTA!!!!! Also keep a log of you blood pressures and bring back to your follow up appt. Please call the office with any questions.   Patients taking blood thinners should generally stay away from medicines like ibuprofen, Advil, Motrin, naproxen, and Aleve due to risk of stomach bleeding. You may take Tylenol as directed or talk to your primary doctor about alternatives.   Increase activity slowly   Complete by: As directed        Discharge Medications     Medication List    STOP taking these medications   atenolol 25 MG tablet Commonly known as: TENORMIN   ibuprofen 200 MG tablet Commonly known as: ADVIL   lisinopril 40 MG tablet Commonly known as: ZESTRIL   lisinopril-hydrochlorothiazide 20-12.5 MG tablet Commonly known as: ZESTORETIC   sertraline 100 MG tablet Commonly known as: Zoloft     TAKE these medications   amLODipine 10 MG tablet Commonly known as: NORVASC Take 10 mg by mouth daily.   aspirin 81 MG chewable tablet Chew 1 tablet (81 mg  total) by mouth daily. Start taking on: February 28, 2019   atorvastatin 80 MG tablet Commonly known as: LIPITOR Take 1 tablet (80 mg total) by mouth daily at 6 PM. What changed:   medication strength  how much to take  when to take this   blood glucose meter kit and supplies Dispense based on patient and insurance preference. Use up to four times daily as directed. (FOR ICD-10 E10.9, E11.9).   clonazePAM 0.5 MG tablet Commonly known as: KLONOPIN Take 0.5-1 mg by mouth 2 (two) times daily as needed for anxiety.   metFORMIN 500 MG tablet Commonly known as: GLUCOPHAGE Take 1 tablet (500 mg total) by mouth 2 (two) times daily with a meal. Start taking on: February 28, 2019   metoprolol succinate 50 MG 24 hr tablet Commonly known as: TOPROL-XL Take 1 tablet (50 mg total) by mouth daily. Take with or immediately following a meal. Start taking on: February 28, 2019   nitroGLYCERIN 0.4 MG SL tablet Commonly known as: NITROSTAT Place 1 tablet (0.4 mg total) under the tongue every 5 (five) minutes x 3 doses as needed for chest pain.   pantoprazole 40 MG tablet Commonly known as: PROTONIX Take 1 tablet (40 mg total) by mouth daily.   ticagrelor 90 MG Tabs tablet Commonly known as: BRILINTA Take 1 tablet (90 mg total) by mouth 2 (two) times daily.       Acute coronary syndrome (MI, NSTEMI, STEMI, etc) this admission?: Yes.     AHA/ACC Clinical Performance & Quality Measures: 1. Aspirin prescribed? - Yes 2. ADP Receptor Inhibitor (Plavix/Clopidogrel, Brilinta/Ticagrelor or Effient/Prasugrel) prescribed (includes medically managed patients)? - Yes 3. Beta Blocker prescribed? - Yes 4. High Intensity Statin (Lipitor 40-57m or Crestor 20-437m prescribed? - Yes 5. EF assessed during THIS hospitalization? - Yes 6. For EF <40%, was ACEI/ARB prescribed? - Not Applicable (EF >/= 4052%7. For EF <40%, Aldosterone Antagonist (Spironolactone or Eplerenone) prescribed? - Not Applicable  (EF >/= 4077%8. Cardiac Rehab Phase II ordered (Included Medically managed Patients)? - Yes   Outstanding Labs/Studies   FLP/LFTs in 6 weeks. BMET at follow up  Duration of Discharge Encounter   Greater than 30 minutes including physician time.  Signed, LiReino BellisP-C 02/27/2019, 11:50 AM

## 2019-02-27 NOTE — Progress Notes (Signed)
Progress Note  Patient Name: Lafe GarinDavid A Boer Date of Encounter: 02/27/2019  Primary Cardiologist: [place]  Subjective   No chest pain or dyspnea.   Inpatient Medications    Scheduled Meds: . aspirin  81 mg Oral Daily  . atorvastatin  80 mg Oral q1800  . Chlorhexidine Gluconate Cloth  6 each Topical Daily  . enoxaparin (LOVENOX) injection  40 mg Subcutaneous Q24H  . lisinopril  20 mg Oral Daily   And  . hydrochlorothiazide  12.5 mg Oral Daily  . insulin aspart  0-15 Units Subcutaneous TID WC  . metoprolol succinate  50 mg Oral Daily  . pantoprazole  40 mg Oral Daily  . sertraline  100 mg Oral Daily  . sodium chloride flush  3 mL Intravenous Q12H  . ticagrelor  90 mg Oral BID  . topiramate  75 mg Oral QHS   Continuous Infusions: . sodium chloride     PRN Meds: sodium chloride, acetaminophen, nitroGLYCERIN, ondansetron (ZOFRAN) IV, sodium chloride flush   Vital Signs    Vitals:   02/27/19 0600 02/27/19 0700 02/27/19 0751 02/27/19 0800  BP: (!) 158/90     Pulse:      Resp: 14 (!) 33  19  Temp:   98.3 F (36.8 C)   TempSrc:   Oral   SpO2: 98%     Weight:      Height:        Intake/Output Summary (Last 24 hours) at 02/27/2019 0843 Last data filed at 02/27/2019 0800 Gross per 24 hour  Intake 852.6 ml  Output 875 ml  Net -22.4 ml   Last 3 Weights 02/26/2019 02/25/2019 09/02/2014  Weight (lbs) 246 lb 7.6 oz 230 lb 241 lb 12.8 oz  Weight (kg) 111.8 kg 104.327 kg 109.68 kg      Telemetry    Sinus - Personally Reviewed  ECG    Sinus - Personally Reviewed  Physical Exam   GEN: No acute distress.   Neck: No JVD Cardiac: RRR, no murmurs, rubs, or gallops.  Respiratory: Clear to auscultation bilaterally. GI: Soft, nontender, non-distended  MS: No edema; No deformity. Neuro:  Nonfocal  Psych: Normal affect   Labs    High Sensitivity Troponin:   Recent Labs  Lab 02/25/19 2337 02/26/19 0222  TROPONINIHS 40* 361*      Chemistry Recent Labs  Lab  02/25/19 2337 02/26/19 0222  NA 134* 135  K 3.5 3.8  CL 102 104  CO2 20* 20*  GLUCOSE 276* 150*  BUN 16 17  CREATININE 0.97 0.83  CALCIUM 8.7* 8.5*  GFRNONAA >60 >60  GFRAA >60 >60  ANIONGAP 12 11     Hematology Recent Labs  Lab 02/25/19 2337 02/26/19 0222  WBC 6.8 7.1  RBC 5.39 5.03  HGB 14.7 13.6  HCT 46.4 42.1  MCV 86.1 83.7  MCH 27.3 27.0  MCHC 31.7 32.3  RDW 14.6 14.5  PLT 143* 127*    BNP Recent Labs  Lab 02/26/19 0222  BNP 28.7     DDimer No results for input(s): DDIMER in the last 168 hours.   Radiology    Dg Chest Port 1 View  Result Date: 02/26/2019 CLINICAL DATA:  Chest pain EXAM: PORTABLE CHEST 1 VIEW COMPARISON:  July 16, 2014 FINDINGS: The heart size and mediastinal contours are within normal limits. Both lungs are clear. The visualized skeletal structures are unremarkable. IMPRESSION: No acute cardiopulmonary process. Electronically Signed   By: Jonna ClarkBindu  Avutu M.D.   On:  02/26/2019 00:31    Cardiac Studies   Echo 02/26/19:  1. The left ventricle has normal systolic function, with an ejection fraction of 55-60%. The cavity size was normal. There is moderately increased left ventricular wall thickness. Left ventricular diastolic Doppler parameters are consistent with  pseudonormalization.  2. There is mild mitral annular calcification present.  3. The aortic valve is tricuspid. Mild sclerosis of the aortic valve.  4. The aorta is normal unless otherwise noted.  5. The inferior vena cava was dilated in size with >50% respiratory variability.  Cardiac cath 02/26/19:  Prox LAD to Mid LAD lesion is 20% stenosed.  2nd Mrg lesion is 40% stenosed.  Dist Cx lesion is 40% stenosed with 40% stenosed side branch in LPAV.  Prox RCA lesion is 100% stenosed.  Post intervention, there is a 0% residual stenosis.  A drug-eluting stent was successfully placed using a STENT SYNERGY DES 2.5X16.  The left ventricular systolic function is normal.  LV end  diastolic pressure is normal.  The left ventricular ejection fraction is 55-65% by visual estimate.  1. 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 Normal LV function 3. Normal LVEDP 4. Successful PCI of the RCA with DES x 1  Plan: DAPT for one year. Patient is a candidate for fast track DC.   Patient Profile     58 y.o. male with HTN, HLD, tobacco abuse and new diagnosis of CAD, admitted 02/26/19 at acute inferior STEMI. A drug eluting stent was placed in the RCA  Assessment & Plan    1. CAD/Inferior STEMI: Doing well this am. No chest pain. Will continue DAPT with ASA and Brilinta for one year. Continue statin and beta blocker.   2. HTN: BP elevated today. Will get him back on home dose of amlodipine 10 mg daily. Will d/c Lisinopril and HCTZ.   3. DM: HbgA1C is 7. Will start metformin 500 mg po BID, 48 hours post cath.   Will discharge today. Follow up one week. Long term f/u with Dr. Martinique.   For questions or updates, please contact [place] Please consult [email or web address] for contact info under        Signed, Lauree Chandler, MD  02/27/2019, 8:43 AM

## 2019-02-27 NOTE — Plan of Care (Signed)
Patient did not want to watch videos.  Handouts and booklets given for discharge.  All questions answered and support given.

## 2019-02-27 NOTE — Discharge Instructions (Signed)
Heart Attack °The heart is a muscle that needs oxygen to survive. A heart attack is a condition that occurs when your heart does not get enough oxygen. When this happens, the heart muscle begins to die. This can cause permanent damage if not treated right away. A heart attack is a medical emergency. °This condition may be called a myocardial infarction, or MI. It is also known as acute coronary syndrome (ACS). ACS is a term used to describe a group of conditions that affect blood flow to the heart. °What are the causes? °This condition may be caused by: °· Atherosclerosis. This occurs when a fatty substance called plaque builds up in the arteries and blocks or reduces blood supply to the heart. °· A blood clot. A blood clot can develop suddenly when plaque breaks up within an artery and blocks blood flow to the heart. °· Low blood pressure. °· An abnormal heartbeat (arrhythmia). °· Conditions that cause a decrease of oxygen to the heart, such as anemiaorrespiratory failure. °· A spasm, or severe tightening, of a blood vessel that cuts off blood flow to the heart. °· Tearing of a coronary artery (spontaneous coronary artery dissection). °· High blood pressure. °What increases the risk? °The following factors may make you more likely to develop this condition: °· Aging. The older you are, the higher your risk. °· Having a personal or family history of chest pain, heart attack, stroke, or narrowing of the arteries in the legs, arms, head, or stomach (peripheral artery disease). °· Being male. °· Smoking. °· Not getting regular exercise. °· Being overweight or obese. °· Having high blood pressure. °· Having high cholesterol (hypercholesterolemia). °· Having diabetes. °· Drinking too much alcohol. °· Using illegal drugs, such as cocaine or methamphetamine. °What are the signs or symptoms? °Symptoms of this condition may vary, depending on factors like gender and age. Symptoms may include: °· Chest pain. It may feel  like: °? Crushing or squeezing. °? Tightness, pressure, fullness, or heaviness. °· Pain in the arm, neck, jaw, back, or upper body. °· Shortness of breath. °· Heartburn or upset stomach. °· Nausea. °· Sudden cold sweats. °· Feeling tired. °· Sudden light-headedness. °How is this diagnosed? °This condition may be diagnosed through tests, such as: °· Electrocardiogram (ECG) to measure the electrical activity of your heart. °· Blood tests to check for cardiac markers. These chemicals are released by a damaged heart muscle. °· A test to evaluate blood flow and heart function (coronary angiogram). °· CT scan to see the heart more clearly. °· A test to evaluate the pumping action of the heart (echocardiogram). °How is this treated? °A heart attack must be treated as soon as possible. Treatment may include: °· Medicines to: °? Break up or dissolve blood clots (fibrinolytic therapy). °? Thin blood and help prevent blood clots. °? Treat blood pressure. °? Improve blood flow to the heart. °? Reduce pain. °? Reduce cholesterol. °· Angioplasty and stent placement. These are procedures to widen a blocked artery and keep it open. °· Coronary artery bypass graft, CABG, or open heart surgery. This enables blood to flow to the heart by going around the blocked part of the artery. °· Oxygen therapy if needed. °· Cardiac rehabilitation. This improves your health and well-being through exercise, education, and counseling. °Follow these instructions at home: °Medicines °· Take over-the-counter and prescription medicines only as told by your health care provider. °· Do not take the following medicines unless your health care provider says it is okay   to take them: ? NSAIDs, such as ibuprofen. ? Supplements that contain vitamin A, vitamin E, or both. ? Hormone replacement therapy that contains estrogen with or without progestin. Lifestyle   Do not use any products that contain nicotine or tobacco, such as cigarettes, e-cigarettes,  and chewing tobacco. If you need help quitting, ask your health care provider.  Avoid secondhand smoke.  Exercise regularly. Ask your health care provider about participating in a cardiac rehabilitation program that helps you start exercising safely after a heart attack.  Eat a heart-healthy diet. Your health care provider will tell you what foods to eat.  Maintain a healthy weight.  Learn ways to manage stress.  Do not use illegal drugs. Alcohol use  Do not drink alcohol if: ? Your health care provider tells you not to drink. ? You are pregnant, may be pregnant, or are planning to become pregnant.  If you drink alcohol: ? Limit how much you use to:  0-1 drink a day for women.  0-2 drinks a day for men. ? Be aware of how much alcohol is in your drink. In the [place] one drink equals one 12 oz bottle of beer (355 mL), one 5 oz glass of wine (148 mL), or one 1 oz glass of hard liquor (44 mL). General instructions  Work with your health care provider to manage any other conditions you have, such as high blood pressure or diabetes. These conditions affect your heart.  Get screened for depression, and seek treatment if needed.  Keep your vaccinations up to date. Get the flu vaccine every year.  Keep all follow-up visits as told by your health care provider. This is important. Contact a health care provider if:  You feel overwhelmed or sad.  You have trouble doing your daily activities. Get help right away if:  You have sudden, unexplained discomfort in your chest, arms, back, neck, jaw, or upper body.  You have shortness of breath.  You suddenly start to sweat or your skin gets clammy.  You feel nauseous or you vomit.  You have unexplained tiredness or weakness.  You suddenly feel light-headed or dizzy.  You notice your heart starts to beat fast or feels like it is skipping beats.  You have blood pressure that is higher than 180/120. These symptoms may represent a  serious problem that is an emergency. Do not wait to see if the symptoms will go away. Get medical help right away. Call your local emergency services (911 in the [place] Do not drive yourself to the hospital. Summary  A heart attack, also called myocardial infarction, is a condition that occurs when your heart does not get enough oxygen. This is caused by anything that blocks or reduces blood flow to the heart.  Treatment is a combination of medicines and surgeries, if needed, to open the blocked arteries and restore blood flow to the heart.  A heart attack is an emergency. Get help right away if you have sudden discomfort in your chest, arms, back, neck, jaw, or upper body. Seek help if you feel nauseous, you vomit, or you feel light-headed or dizzy. This information is not intended to replace advice given to you by your health care provider. Make sure you discuss any questions you have with your health care provider. Document Released: 06/07/2005 Document Revised: 09/14/2018 Document Reviewed: 09/18/2018 Elsevier Patient Education  2020 [employer].   Sexual Activity and Heart Disease Sexual intimacy is an important part of your well-being. After a heart  procedure or damaging heart occurrence (cardiac event), you may be worried about being sexually active. If you or your partner have any worries or questions about sexual activity, be sure to discuss them with your health care provider. Most people can continue to have an active sex life. When can I resume sexual activity? How soon it is safe to resume sexual activity--including sex, masturbation, and oral sex--depends on the type of heart procedure or cardiac event that you had. For example:  If you had a heart attack, you may be able to have sex after 2 weeks.  If you had a complicated cardiac event or heart surgery, you may have to wait up to 8 weeks before resuming sexual activity. Ask your health care provider when it is safe for you to  resume sexual activity. How do I know when I am ready to resume sexual activity? How soon you are ready to resume sexual activity depends on:  Your physical comfort.  Your mental readiness.  Your sexual habits. Sexual activity involves at least as much energy as climbing two flights of stairs or walking briskly for 20 minutes. It is okay to have sex if you can do these activities without having any of the following problems:  Discomfort in your chest, neck, or arm (angina).  Shortness of breath.  Excessive tiredness. To check whether you are ready to resume sexual activity, your health care provider may have you take an exercise test. This test involves using a treadmill or stationary bicycle while your heart and blood pressure is monitored. The test shows how your heart handles activity. What do I need to know about sexual activity after a cardiac event? Medicines  Certain prescription medicines can affect sexual function. They can decrease your desire for sex, decrease vaginal lubrication, make it hard for you to achieve or maintain an erection, or make it impossible to achieve an orgasm. If you have any of these problems while taking a medicine, do not stop taking the medicine. Talk with your health care provider about the problem.  Talk with your health care provider before taking any herbal medicines or vitamins. They can interfere with prescription medicines and your heart function.  If you are thinking about starting birth control, discuss it with your health care provider first. This is important.  If you take medicine for sexual dysfunction: ? Avoid medicine such as nitroglycerin or long-acting nitrate medicine for 24 hours. Taking a medicine for sexual dysfunction and a nitrate medicine together can cause a serious drop in blood pressure. Intimacy The stress of your surgery or cardiac event can affect intimacy between you and your partner. When you decide to have sex:  Choose  a relaxing atmosphere.  Feel rested and relaxed.  Talk openly and honestly with each other.  Be patient with each other.  Start slowly, and gradually increase intimacy. You can increase intimacy by doing things like caressing, touching, and holding each other. Follow these instructions at home: Lifestyle   Consider participating in a cardiac rehabilitation program or exercising regularly. This can benefit your sex life by building strength and endurance.  Ask your health care provider what exercises are safe for you.  Eat a healthy diet that includes lots of fruits and vegetables, less red meat, and fewer high-fat dairy products. Sexual activity  Avoid having sex after a heavy meal.  Avoid having too much alcohol before sex.  Ask your partner to take a more active role during sex.  If you have  angina during sex and are not on a sexual dysfunction drug, stop having sex and take a nitroglycerin as directed by your health care provider. If the angina goes away, you may resume sexual activity. If your symptoms do not go away within 5-10 minutes, get help right away. Contact a health care provider if:  You are feeling depressed.  You have pain during sex.  You are having difficulty returning to sexual activity. Get help right away if:  You have chest pain or shortness of breath.  You feel dizzy or light-headed during or after sexual activity. Summary  Ask your health care provider when it is safe for you to resume sexual activity.  Consider participating in a cardiac rehabilitation program or exercising regularly. This can benefit your sex life by building strength and endurance.  Start slowly, and gradually increase intimacy. This information is not intended to replace advice given to you by your health care provider. Make sure you discuss any questions you have with your health care provider. Document Released: 03/28/2013 Document Revised: 05/20/2017 Document Reviewed:  03/19/2016 Elsevier Patient Education  2020 [employer].   Exercise Guidelines During Cardiac Rehabilitation When you are recovering from a heart condition, such as from heart surgery, heart attack, or heart failure, it is important to have heart-healthy habits, including exercise routines. Discuss an appropriate exercise program with your heart specialist (cardiologist) and rehabilitation therapist. It is important to design a program that is safe and effective for you. The program should meet your specific abilities and needs. Walking, biking, jogging, and swimming are all good aerobic activities. These take light to moderate effort. Adding some light resistance training is also important. Even simple lifestyle changes can help. These lifestyle changes may include parking farther from the store or taking the stairs instead of the elevator. At first, you may begin exercising under supervision, such as at a hospital or clinic. Over time, you may begin exercising at home, with your health care provider's approval. Types of exercise Aerobic exercise During cardiac rehabilitation, it is important to do aerobic activities. Aerobic exercise keeps joints and muscles moving. It involves large muscle groups. It is also rhythmic and must be done for a longer period of time. Doing these exercises improves circulation and endurance. Examples of aerobic exercise include:  Swimming.  Walking.  Hiking.  Jogging.  Cross-country skiing.  Biking.  Dancing.  Static exercise Static exercise (isometric exercise) uses muscles at high intensities without moving the joints. Some examples of static exercise include pushing against a heavy couch that does not move, doing a wall sit, or holding a plank position. Static exercise improves strength but also quickly increases blood pressure. Follow these guidelines:  If you have circulation problems or high blood pressure, talk with your health care provider before  starting any static exercise routines. Do not do static exercises if your health care provider tells you not to.  Do not hold your breath while doing static exercises. Holding your breath during static exercises can raise your blood pressure to a dangerously high level.  Weight-resistance exercise Weight-resistance exercises are another important part of rehabilitation. These exercises strengthen your muscles by making them work against resistance. Resistance exercises may help you return to activities of daily living sooner and improve your quality of life. They also help reduce cardiac risk factors. Examples of weight-resistance exercise include using:  Free weights.  Weight-lifting machines.  Large, specially designed rubber bands. You will usually do weight-resistance exercises 2 times a week with  a 2-day rest period between workouts. Stretching Stretching before you exercise warms up your muscles and prevents injury. Stretching also improves your flexibility, balance, coordination, and range of motion. Follow these guidelines:  Stretch both before and after exercising.  Do not force a muscle or joint into a painful angle. Stretching should be a relaxing part of your exercise routine.  Once you feel resistance in your muscle, hold the stretch for a few seconds. Make sure you keep breathing while you hold the stretch.  Go slowly when doing all stretches. Setting a pace  Choose a pace that is comfortable for you. ? You should be able to talk while exercising. If you are short of breath or unable to speak while you exercise, slow down. ? If you are able to sing while exercising, you are not exercising hard enough.  Keep track of how hard you are working as you exercise (exertion level). Your rehabilitation therapist can teach you to use a mental scale to measure your level of exertion (perceived exertion). Using a mental scale, you will think about your exertion level and rate it in a  range from 6 to 20. ? A rating of 6 to 9. This means that you are doing "very light" exercise and are not exerting yourself enough. For a healthy person, this may be walking at a slow pace. ? A rating of 11 to 15. This is exercise that is "somewhat hard." For a healthy exercise session, you should aim for an exertion rate that is within this range. ? A rating of 16 to 17. This is considered "very hard" or strenuous. For a healthy person, exercise at this rating may start to feel heavy and difficult. ? A rating of 19 to 20. This means that you are working "extremely hard." For most people, these numbers represent the hardest you've ever worked to exercise.  Your health care provider or cardiac rehabilitation specialist may also recommend that you wear a heart rate monitor while you exercise. This will help you keep track of your heart rate zones and how hard your heart is working. Frequency As you are recovering, it is important to start exercising slowly and to gradually work up to your goal. Work with your health care provider to set up an exercise routine that works for you. Generally, cardiac rehabilitation exercise should include:  40 minutes of aerobic activity 3 - 4 days a week.  Stretching and strength exercises 2 - 3 days a week. Contact a doctor if:  You have any of the following symptoms while exercising: ? Pain, pressure, or burning in your chest, jaw, shoulder, or back (angina). ? Lightheadedness. ? Dizziness. ? Irregular or fast heartbeat. ? Shortness of breath.  You are extremely tired after exercising. Get help right away if:  You have angina that does not get better with medicine and lasts for more than 5 minutes.  You have nausea or you vomit.  You have excessive sweating that is not caused by exercise. Summary  When you are recovering from a heart condition, it is important to have heart-healthy habits, including exercise routines.  At first, you may begin  exercising under supervision, such as at a hospital or clinic. Over time, you may begin exercising at home, with your health care provider's approval.  Aim for 40 minutes of aerobic exercises 3 - 4 days a week.  Aim to do stretching and strength exercises 2 - 3 days a week.  Choose a pace that is comfortable  for you. You should be able to talk while exercising. This information is not intended to replace advice given to you by your health care provider. Make sure you discuss any questions you have with your health care provider. Document Released: 06/12/2013 Document Revised: 05/20/2017 Document Reviewed: 05/07/2016 Elsevier Patient Education  2020 [employer].                        Carbohydrate Counting For People With Diabetes  Why Is Carbohydrate Counting Important?  Counting carbohydrate servings may help you control your blood glucose level so that you feel better.   The balance between the carbohydrates you eat and insulin determines what your blood glucose level will be after eating.   Carbohydrate counting can also help you plan your meals.  Which Foods Have Carbohydrates? Foods with carbohydrates include:  Breads, crackers, and cereals   Pasta, rice, and grains   Starchy vegetables, such as potatoes, corn, and peas   Beans and legumes   Milk, soy milk, and yogurt   Fruits and fruit juices   Sweets, such as cakes, cookies, ice cream, jam, and jelly  Carbohydrate Servings In diabetes meal planning, 1 serving of a food with carbohydrate has about 15 grams of carbohydrate:  Check serving sizes with measuring cups and spoons or a food scale.   Read the Nutrition Facts on food labels to find out how many grams of carbohydrate are in foods you eat. The food lists in this handout show portions that have about 15 grams of carbohydrate.  Tips Meal Planning Tips  An Eating Plan tells you how many carbohydrate servings to eat at your meals and  snacks. For many adults, eating 3 to 5 servings of carbohydrate foods at each meal and 1 or 2 carbohydrate servings for each snack works well.   In a healthy daily Eating Plan, most carbohydrates come from:   At least 6 servings of fruits and nonstarchy vegetables   At least 6 servings of grains, beans, and starchy vegetables, with at least 3 servings from whole grains   At least 2 servings of milk or milk products  Check your blood glucose level regularly. It can tell you if you need to adjust when you eat carbohydrates.   Eating foods that have fiber, such as whole grains, and having very few salty foods is good for your health.   Eat 4 to 6 ounces of meat or other protein foods (such as soybean burgers) each day. Choose low-fat sources of protein, such as lean beef, lean pork, chicken, fish, low-fat cheese, or vegetarian foods such as soy.   Eat some healthy fats, such as olive oil, canola oil, and nuts.   Eat very little saturated fats. These unhealthy fats are found in butter, cream, and high-fat meats, such as bacon and sausage.   Eat very little or no trans fats. These unhealthy fats are found in all foods that list partially hydrogenated oil as an ingredient.   Label Reading Tips The Nutrition Facts panel on a label lists the grams of total carbohydrate in 1 standard serving. The label's standard serving may be larger or smaller than 1 carbohydrate serving. To figure out how many carbohydrate servings are in the food:  First, look at the label's standard serving size.   Check the grams of total carbohydrate. This is the amount of carbohydrate in 1 standard serving.   Divide the grams of total carbohydrate by 15. This number  equals the number of carbohydrate servings in 1 standard serving. Remember: 1 carbohydrate serving is 15 grams of carbohydrate.   Note: You may ignore the grams of sugars on the Nutrition Facts panel because they are included in the grams of total  carbohydrate.  Foods Recommended 1 serving = about 15 grams of carbohydrate Starches  1 slice bread (1 ounce)   1 tortilla (6-inch size)    large bagel (1 ounce)   2 taco shells (5-inch size)    hamburger or hot dog bun ( ounce)    cup ready-to-eat unsweetened cereal    cup cooked cereal   1 cup broth-based soup   4 to 6 small crackers   1/3 cup pasta or rice (cooked)    cup beans, peas, corn, sweet potatoes, winter squash, or mashed or boiled potatoes (cooked)    large baked potato (3 ounces)    ounce pretzels, potato chips, or tortilla chips   3 cups popcorn (popped) Fruit  1 small fresh fruit ( to 1 cup)    cup canned or frozen fruit   2 tablespoons dried fruit (blueberries, cherries, cranberries, mixed fruit, raisins)   17 small grapes (3 ounces)   1 cup melon or berries    cup unsweetened fruit juice Milk  1 cup fat-free or reduced-fat milk   1 cup soy milk   2/3 cup (6 ounces) nonfat yogurt sweetened with sugar-free sweetener Sweets and Desserts  2-inch square cake (unfrosted)   2 small cookies (2/3 ounce)    cup ice cream or frozen yogurt    cup sherbet or sorbet   1 tablespoon syrup, jam, jelly, table sugar, or honey   2 tablespoons light syrup Other Foods  Count 1 cup raw vegetables or  cup cooked nonstarchy vegetables as zero (0) carbohydrate servings or free foods. If you eat 3 or more servings at one meal, count them as 1 carbohydrate serving.   Foods that have less than 20 calories in each serving also may be counted as zero carbohydrate servings or free foods.   Count 1 cup of casserole or other mixed foods as 2 carbohydrate servings.  Carbohydrate Counting for People with Diabetes Sample 1-Day Menu  Breakfast 1 extra-small banana (1 carbohydrate serving)  1 cup low-fat or fat-free milk (1 carbohydrate serving)  1 slice whole wheat bread (1 carbohydrate serving)  1 teaspoon margarine  Lunch 2 ounces [place]  slices  2 slices whole wheat bread (2 carbohydrate servings)  2 lettuce leaves  4 celery sticks  4 carrot sticks  1 medium apple (1 carbohydrate serving)  1 cup low-fat or fat-free milk (1 carbohydrate serving)  Afternoon Snack 2 tablespoons raisins (1 carbohydrate serving)  3/4 ounce unsalted mini pretzels (1 carbohydrate serving)  Evening Meal 3 ounces lean roast beef  1/2 large baked potato (2 carbohydrate servings)  1 tablespoon reduced-fat sour cream  1/2 cup green beans  1 tablespoon light salad dressing  1 whole wheat dinner roll (1 carbohydrate serving)  1 teaspoon margarine  1 cup melon balls (1 carbohydrate serving)  Evening Snack 2 tablespoons unsalted nuts   Carbohydrate Counting for People with Diabetes Vegan Sample 1-Day Menu  Breakfast 1 cup cooked oatmeal (2 carbohydrate servings)   cup blueberries (1 carbohydrate serving)  2 tablespoons flaxseeds  1 cup soymilk fortified with calcium and vitamin D  1 cup coffee  Lunch 2 slices whole wheat bread (2 carbohydrate servings)   cup baked tofu   cup lettuce  2 slices tomato  2 slices avocado   cup baby carrots  1 orange (1 carbohydrate serving)  1 cup soymilk fortified with calcium and vitamin D   Evening Meal Burrito made with: 1 6-inch corn tortilla (1 carbohydrate serving)  1 cup refried vegetarian beans (1 carbohydrate serving)   cup chopped tomatoes   cup lettuce   cup salsa  1/3 cup brown rice (1 carbohydrate serving)  1 tablespoon olive oil for rice   cup zucchini   Evening Snack 6 small whole grain crackers (1 carbohydrate serving)  2 apricots ( carbohydrate serving)   cup unsalted peanuts ( carbohydrate serving)     Carbohydrate Counting for People with Diabetes Vegetarian (Lacto-Ovo) Sample 1-Day Menu  Breakfast 1 cup cooked oatmeal (2 carbohydrate servings)   cup blueberries (1 carbohydrate serving)  2 tablespoons flaxseeds  1 egg  1 cup 1% milk (1 carbohydrate serving)  1 cup  coffee  Lunch 2 slices whole wheat bread (2 carbohydrate servings)  2 ounces low-fat cheese   cup lettuce  2 slices tomato  2 slices avocado   cup baby carrots  1 orange (1 carbohydrate serving)  1 cup unsweetened tea  Evening Meal Burrito made with: 1 6-inch corn tortilla (1 carbohydrate serving)   cup refried vegetarian beans (1 carbohydrate serving)   cup tomatoes   cup lettuce   cup salsa  1/3 cup brown rice (1 carbohydrate serving)  1 tablespoon olive oil for rice   cup zucchini  1 cup 1% milk (1 carbohydrate serving)  Evening Snack 6 small whole grain crackers (1 carbohydrate serving)  2 apricots ( carbohydrate serving)   cup unsalted peanuts ( carbohydrate serving)    Copyright 2020  Academy of Nutrition and Dietetics. All rights reserved.  Using Nutrition Labels: Carbohydrate   Serving Size   Look at the serving size. All the information on the label is based on this portion.  Servings Per Container   The number of servings contained in the package.  Guidelines for Carbohydrate   Look at the total grams of carbohydrate in the serving size.   1 carbohydrate choice = 15 grams of carbohydrate.  Range of Carbohydrate Grams Per Choice  Carbohydrate Grams/Choice Carbohydrate Choices  6-10   11-20 1  21-25 1  26-35 2  36-40 2  41-50 3  51-55 3  56-65 4  66-70 4  71-80 5    Copyright 2020  Academy of Nutrition and Dietetics. All rights reserved.

## 2019-02-27 NOTE — Progress Notes (Signed)
Patient discharged home with wife after going over all discharge instructions and meds brought to patient by TOC.  All questions answered and support given.

## 2019-02-27 NOTE — Plan of Care (Signed)
Nutrition Education Note  RD consulted for nutrition education regarding diabetes. RD paged by RN due to pt leaving today. RD working remotely.  Lab Results  Component Value Date   HGBA1C 7.0 (H) 02/26/2019   RD spoke with pt via phone call to room. Pt endorses speaking with Diabetes Coordinator earlier today. RD reviewed Diabetes Coordinator note.  Pt reports that he typically eats 2 meals daily and rarely snacks between meals. Pt denies snacking between meals at work but states that when he is home he may.  Morning: cup of coffee with ~1 tsp sugar Lunch: leftovers Dinner: fish or chicken with vegetables (examples: broccoli, green beans, salad), rarely has rice or potatoes  Pt shares that he drinks water, diet soda, and unsweetened tea. Pt states that he hasn't always watched what he drinks but started making changes  to his diet about 1 year ago.  RD attached "Carbohydrate Counting for People with Diabetes" handout from the Academy of Nutrition and Dietetics to pt's Discharge Instructions which will print when he discharges. Discussed different food groups and their effects on blood sugar, emphasizing carbohydrate-containing foods. Provided list of carbohydrates and recommended serving sizes of common foods.  Discussed importance of controlled and consistent carbohydrate intake throughout the day. Provided examples of ways to balance meals/snacks and encouraged intake of high-fiber, whole grain complex carbohydrates. Teach back method used.  Expect good compliance.  Body mass index is 33.43 kg/m. Pt meets criteria for obesity class I based on current BMI.  Current diet order is Heart Healthy/Carb Modified, patient is consuming approximately 100% of meals at this time. Labs and medications reviewed. No further nutrition interventions warranted at this time. RD contact information provided. If additional nutrition issues arise, please re-consult RD.   Gaynell Face, MS, RD,  LDN Inpatient Clinical Dietitian Pager: [phone] Weekend/After Hours: [phone]

## 2019-02-27 NOTE — Progress Notes (Signed)
CARDIAC REHAB PHASE I   PRE:  Rate/Rhythm: 50 sB    BP: sitting 160/85    SaO2:   MODE:  Ambulation: 410 ft   POST:  Rate/Rhythm: 70 SR    BP: sitting 153/87     SaO2:   Tolerated well, no c/o. Discussed MI, stent, Brilinta importance, restrictions, smoking cessation, diet (DM and HH), exercise, NTG, and CRPII. Will refer to [place]. He understands importance of Brilinta. He plans to quit smoking. DM educator there presently discussing DM more in depth. Pt is interested in participating in Virtual Cardiac and Pulmonary Rehab. Pt advised that Virtual Cardiac and Pulmonary Rehab is provided at no cost to the patient. Checklist:  1. Pt has smart device  ie smartphone and/or ipad for downloading an app  Yes 2. Reliable internet/wifi service    Yes 3. Understands how to use their smartphone and navigate within an app.  Yes  Pt verbalized understanding and is in agreement.  [place], ACSM 02/27/2019 10:25 AM

## 2019-02-27 NOTE — Progress Notes (Signed)
Inpatient Diabetes Program Recommendations  AACE/ADA: New Consensus Statement on Inpatient Glycemic Control   Target Ranges:  Prepandial:   less than 140 mg/dL      Peak postprandial:   less than 180 mg/dL (1-2 hours)      Critically ill patients:  140 - 180 mg/dL   Results for Lafe GarinMIRARCHI, Phillp A (MRN [id number]) as of 02/27/2019 09:34  Ref. Range 02/26/2019 08:57 02/26/2019 11:02 02/26/2019 16:14 02/26/2019 21:58 02/27/2019 06:12  Glucose-Capillary Latest Ref Range: 70 - 99 mg/dL 409193 (H) 811155 (H) 914109 (H) 130 (H) 159 (H)  Results for Lafe GarinMIRARCHI, Caedmon A (MRN [id number]) as of 02/27/2019 09:34  Ref. Range 02/26/2019 02:22  Hemoglobin A1C Latest Ref Range: 4.8 - 5.6 % 7.0 (H)   Review of Glycemic Control  Diabetes history: No Outpatient Diabetes medications: NA Current orders for Inpatient glycemic control: Metformin 500 mg BID, Novolog 0-15 units TID with meals  Inpatient Diabetes Program Recommendations:   A1C: A1C 7% on 02/26/19 indicating an average glucose of 154 mg/dl over the past 2-3 months. Patient is being newly dx with DM and per chart will be discharged on Metformin 500 mg BID.  NOTE: Spoke with patient and his wife regarding new DM dx. Patient states that he does not have a history of DM but notes that he had lab work done at PCP about 1 year ago and he was told his labs for glucose were a little on high side of normal.   Discussed A1C results (7% on 02/26/19) and explained what an A1C is and informed patient that his current A1C indicates an average glucose of 154 mg/dl over the past 2-3 months. Discussed basic pathophysiology of DM Type 2, basic home care, importance of checking CBGs and maintaining good CBG control to prevent long-term and short-term complications. Reviewed glucose and A1C goals.   Reviewed signs and symptoms of hyperglycemia and hypoglycemia along with treatment for both. Discussed impact of nutrition, exercise, stress, sickness, and medications on diabetes control. Patient states that  he typically drinks water, unsweetened tea, and diet sodas and notes that he eats healthier since being told he was on the high end of normal with glucose.  Discussed Carb Modified diet and informed patient that RD should be seeing patient today before discharge. Reviewed Living Well with diabetes booklet and encouraged patient to read through entire book. Informed patient that he will be prescribed Metformin for outpatient DM control. Discussed Metformin in more detail along with possible GI side effects.  Patient has insurance through his wife who is employed by [employer]. Discussed Active Health with [employer] insurance and encouraged them to check on signing up for the diabetes program so that DM supplies could possibly be free.  [facility]. Informed patient that if a new glucometer and testing supplies are too expensive at [facility], [facility] and get the Reli-On Prime glucometer for $9 and a box of 50 Reli-On test strips for $9. Asked patient to check his glucose as the doctor instructs him to do and to keep a log book of glucose readings.  Explained how the doctor he follows up with can use the log book to continue to make adjustments with DM medications if needed.  Patient verbalized understanding of information discussed and he states that he has no further questions at this time related to diabetes. Offered to order outpatient DM education but patient declined at this time but will discuss  with PCP if he feels that he needs outpatient DM education.  RNs to provide ongoing basic DM education at bedside.   Thanks, Barnie Alderman, RN, MSN, CDE Diabetes Coordinator Inpatient Diabetes Program [phone] (Team Pager from 8am to 5pm)

## 2019-02-28 ENCOUNTER — Other Ambulatory Visit: Payer: Self-pay | Admitting: *Deleted

## 2019-02-28 LAB — POCT ACTIVATED CLOTTING TIME: Activated Clotting Time: 285 seconds

## 2019-02-28 NOTE — Patient Outreach (Addendum)
[place] [facility]) Care Management  02/28/2019  Timothy May 10-14-1960 [id number]   Subjective: Telephone call to patient's home  / mobile number, no answer, message states voicemail full, and unable to leave a message.   Objective: Per KPN (Knowledge Performance Now, point of care tool) and chart review, patient hospitalized 02/25/2019 -02/27/2019 for STEMI (ST elevation myocardial infarction).   Patient also has a history of diabetes, hypertension, hyperlipidemia, and tobacco abuse.    Assessment:  Received [facility] Transition of care referral on 02/27/2019.   Transition of care follow up pending patient contact.     Plan: RNCM will send unsuccessful outreach letter, [facility] pamphlet, will call patient for 2nd telephone outreach attempt within 4 business days, transition of care follow up, and proceed with case closure, within 10 business days if no return call.     Raynold Blankenbaker H. Annia Friendly, BSN, [place] Management [facility] Telephonic CM Phone: [phone] Fax: [phone]

## 2019-03-01 ENCOUNTER — Other Ambulatory Visit: Payer: Self-pay | Admitting: *Deleted

## 2019-03-01 NOTE — Patient Outreach (Addendum)
[place] [facility]) Care Management  03/01/2019  Timothy May 03/25/1961 [id number]   Subjective: Telephone call to patient's home number, no answer, message states voicemail full, and unable to leave a message.   Objective: Per KPN (Knowledge Performance Now, point of care tool) and chart review, patient hospitalized 02/25/2019 -02/27/2019 for STEMI (ST elevation myocardial infarction).   Patient also has a history of diabetes, hypertension, hyperlipidemia, and tobacco abuse.    Assessment:  Received [facility] Transition of care referral on 02/27/2019.   Transition of care follow up pending patient contact.     Plan: RNCM has sent unsuccessful outreach letter, [facility] pamphlet, assigned RNCM Kelli Churn) will call patient for 3rd telephone outreach attempt within 4 business days, transition of care follow up, and proceed with case closure, within 10 business days if no return call. RNCM will send in-basket update to assigned RNCM.    Jezabel Lecker H. Annia Friendly, BSN, [place] Management [facility] Telephonic CM Phone: [phone] Fax: [phone]

## 2019-03-02 MED FILL — SM NICOTINE 4 MG CHEWING GU: 4 MG | 30 days supply | Qty: 110 | Fill #0

## 2019-03-02 MED FILL — OMRON 3 SERIES BP MONITOR D: 30 days supply | Qty: 1 | Fill #0

## 2019-03-02 MED FILL — NICOTINE 21 MG/24HR PATCH: 21 | 14 days supply | Qty: 14 | Fill #0

## 2019-03-06 ENCOUNTER — Ambulatory Visit: Payer: 59 | Admitting: General Practice

## 2019-03-06 ENCOUNTER — Encounter: Payer: Self-pay | Admitting: General Practice

## 2019-03-06 ENCOUNTER — Other Ambulatory Visit: Payer: Self-pay

## 2019-03-06 ENCOUNTER — Other Ambulatory Visit: Payer: Self-pay | Admitting: *Deleted

## 2019-03-06 VITALS — BP 136/70 | HR 57 | Temp 98.2°F | Ht 71.0 in | Wt 240.0 lb

## 2019-03-06 DIAGNOSIS — E7800 Pure hypercholesterolemia, unspecified: Secondary | ICD-10-CM

## 2019-03-06 DIAGNOSIS — I10 Essential (primary) hypertension: Secondary | ICD-10-CM | POA: Diagnosis not present

## 2019-03-06 DIAGNOSIS — I2111 ST elevation (STEMI) myocardial infarction involving right coronary artery: Secondary | ICD-10-CM

## 2019-03-06 DIAGNOSIS — E118 Type 2 diabetes mellitus with unspecified complications: Secondary | ICD-10-CM | POA: Diagnosis not present

## 2019-03-06 DIAGNOSIS — Z720 Tobacco use: Secondary | ICD-10-CM

## 2019-03-06 MED ORDER — LISINOPRIL 10 MG PO TABS: 10.0000 mg | ORAL_TABLET | Freq: Every day | ORAL | 3 refills | Status: DC

## 2019-03-06 MED FILL — LISINOPRIL 10 MG TABS: 10 | 90 days supply | Qty: 90 | Fill #0

## 2019-03-06 NOTE — Patient Instructions (Signed)
Medication Instructions:   START Lisinopril 10 mg daily  If you need a refill on your cardiac medications before your next appointment, please call your pharmacy.   Lab work: NONE ordered at this time of appointment   If you have labs (blood work) drawn today and your tests are completely normal, you will receive your results only by: Marland Kitchen MyChart Message (if you have MyChart) OR . A paper copy in the mail If you have any lab test that is abnormal or we need to change your treatment, we will call you to review the results.  Testing/Procedures: NONE ordered at this time of appointment   Follow-Up: At [facility], you and your health needs are our priority.  As part of our continuing mission to provide you with exceptional heart care, we have created designated Provider Care Teams.  These Care Teams include your primary Cardiologist (physician) and Advanced Practice Providers (APPs -  Physician Assistants and Nurse Practitioners) who all work together to provide you with the care you need, when you need it. . You will need to follow up with Edd Fabian, NP or Azalee Course, PA-C in 2 weeks  Any Other Special Instructions Will Be Listed Below (If Applicable).  You will need to continue to monitor your blood pressure at home   You will also need to being in your blood pressure machine at your follow up appointment     Heart-Healthy Eating Plan Heart-healthy meal planning includes:  Eating less unhealthy fats.  Eating more healthy fats.  Making other changes in your diet. Talk with your doctor or a diet specialist (dietitian) to create an eating plan that is right for you. What is my plan? Your doctor may recommend an eating plan that includes:  Total fat: ______% or less of total calories a day.  Saturated fat: ______% or less of total calories a day.  Cholesterol: less than _________mg a day. What are tips for following this plan? Cooking Avoid frying your food. Try to bake,  boil, grill, or broil it instead. You can also reduce fat by:  Removing the skin from poultry.  Removing all visible fats from meats.  Steaming vegetables in water or broth. Meal planning   At meals, divide your plate into four equal parts: ? Fill one-half of your plate with vegetables and green salads. ? Fill one-fourth of your plate with whole grains. ? Fill one-fourth of your plate with lean protein foods.  Eat 4-5 servings of vegetables per day. A serving of vegetables is: ? 1 cup of raw or cooked vegetables. ? 2 cups of raw leafy greens.  Eat 4-5 servings of fruit per day. A serving of fruit is: ? 1 medium whole fruit. ?  cup of dried fruit. ?  cup of fresh, frozen, or canned fruit. ?  cup of 100% fruit juice.  Eat more foods that have soluble fiber. These are apples, broccoli, carrots, beans, peas, and barley. Try to get 20-30 g of fiber per day.  Eat 4-5 servings of nuts, legumes, and seeds per week: ? 1 serving of dried beans or legumes equals  cup after being cooked. ? 1 serving of nuts is  cup. ? 1 serving of seeds equals 1 tablespoon. General information  Eat more home-cooked food. Eat less restaurant, buffet, and fast food.  Limit or avoid alcohol.  Limit foods that are high in starch and sugar.  Avoid fried foods.  Lose weight if you are overweight.  Keep track of  how much salt (sodium) you eat. This is important if you have high blood pressure. Ask your doctor to tell you more about this.  Try to add vegetarian meals each week. Fats  Choose healthy fats. These include olive oil and canola oil, flaxseeds, walnuts, almonds, and seeds.  Eat more omega-3 fats. These include salmon, mackerel, sardines, tuna, flaxseed oil, and ground flaxseeds. Try to eat fish at least 2 times each week.  Check food labels. Avoid foods with trans fats or high amounts of saturated fat.  Limit saturated fats. ? These are often found in animal products, such as meats,  butter, and cream. ? These are also found in plant foods, such as palm oil, palm kernel oil, and coconut oil.  Avoid foods with partially hydrogenated oils in them. These have trans fats. Examples are stick margarine, some tub margarines, cookies, crackers, and other baked goods. What foods can I eat? Fruits All fresh, canned (in natural juice), or frozen fruits. Vegetables Fresh or frozen vegetables (raw, steamed, roasted, or grilled). Green salads. Grains Most grains. Choose whole wheat and whole grains most of the time. Rice and pasta, including brown rice and pastas made with whole wheat. Meats and other proteins Lean, well-trimmed beef, veal, pork, and lamb. Chicken and [place] without skin. All fish and shellfish. Wild duck, rabbit, pheasant, and venison. Egg whites or low-cholesterol egg substitutes. Dried beans, peas, lentils, and tofu. Seeds and most nuts. Dairy Low-fat or nonfat cheeses, including ricotta and mozzarella. Skim or 1% milk that is liquid, powdered, or evaporated. Buttermilk that is made with low-fat milk. Nonfat or low-fat yogurt. Fats and oils Non-hydrogenated (trans-free) margarines. Vegetable oils, including soybean, sesame, sunflower, olive, peanut, safflower, corn, canola, and cottonseed. Salad dressings or mayonnaise made with a vegetable oil. Beverages Mineral water. Coffee and tea. Diet carbonated beverages. Sweets and desserts Sherbet, gelatin, and fruit ice. Small amounts of dark chocolate. Limit all sweets and desserts. Seasonings and condiments All seasonings and condiments. The items listed above may not be a complete list of foods and drinks you can eat. Contact a dietitian for more options. What foods should I avoid? Fruits Canned fruit in heavy syrup. Fruit in cream or butter sauce. Fried fruit. Limit coconut. Vegetables Vegetables cooked in cheese, cream, or butter sauce. Fried vegetables. Grains Breads that are made with saturated or trans fats,  oils, or whole milk. Croissants. Sweet rolls. Donuts. High-fat crackers, such as cheese crackers. Meats and other proteins Fatty meats, such as hot dogs, ribs, sausage, bacon, rib-eye roast or steak. High-fat deli meats, such as salami and bologna. Caviar. Domestic duck and goose. Organ meats, such as liver. Dairy Cream, sour cream, cream cheese, and creamed cottage cheese. Whole-milk cheeses. Whole or 2% milk that is liquid, evaporated, or condensed. Whole buttermilk. Cream sauce or high-fat cheese sauce. Yogurt that is made from whole milk. Fats and oils Meat fat, or shortening. Cocoa butter, hydrogenated oils, palm oil, coconut oil, palm kernel oil. Solid fats and shortenings, including bacon fat, salt pork, lard, and butter. Nondairy cream substitutes. Salad dressings with cheese or sour cream. Beverages Regular sodas and juice drinks with added sugar. Sweets and desserts Frosting. Pudding. Cookies. Cakes. Pies. Milk chocolate or white chocolate. Buttered syrups. Full-fat ice cream or ice cream drinks. The items listed above may not be a complete list of foods and drinks to avoid. Contact a dietitian for more information. Summary  Heart-healthy meal planning includes eating less unhealthy fats, eating more healthy fats, and making  other changes in your diet.  Eat a balanced diet. This includes fruits and vegetables, low-fat or nonfat dairy, lean protein, nuts and legumes, whole grains, and heart-healthy oils and fats. This information is not intended to replace advice given to you by your health care provider. Make sure you discuss any questions you have with your health care provider. Document Released: 12/07/2011 Document Revised: 08/11/2017 Document Reviewed: 07/15/2017 Elsevier Patient Education  2020 [employer]  Smoking Tobacco Information, Adult Smoking tobacco can be harmful to your health. Tobacco contains a poisonous (toxic), colorless chemical called nicotine. Nicotine is  addictive. It changes the brain and can make it hard to stop smoking. Tobacco also has other toxic chemicals that can hurt your body and raise your risk of many cancers. How can smoking tobacco affect me? Smoking tobacco puts you at risk for:  Cancer. Smoking is most commonly associated with lung cancer, but can also lead to cancer in other parts of the body.  Chronic obstructive pulmonary disease (COPD). This is a long-term lung condition that makes it hard to breathe. It also gets worse over time.  High blood pressure (hypertension), heart disease, stroke, or heart attack.  Lung infections, such as pneumonia.  Cataracts. This is when the lenses in the eyes become clouded.  Digestive problems. This may include peptic ulcers, heartburn, and gastroesophageal reflux disease (GERD).  Oral health problems, such as gum disease and tooth loss.  Loss of taste and smell. Smoking can affect your appearance by causing:  Wrinkles.  Yellow or stained teeth, fingers, and fingernails. Smoking tobacco can also affect your social life, because:  It may be challenging to find places to smoke when away from home. Many workplaces, [employer], hotels, and public places are tobacco-free.  Smoking is expensive. This is due to the cost of tobacco and the long-term costs of treating health problems from smoking.  Secondhand smoke may affect those around you. Secondhand smoke can cause lung cancer, breathing problems, and heart disease. Children of smokers have a higher risk for: ? Sudden infant death syndrome (SIDS). ? Ear infections. ? Lung infections. If you currently smoke tobacco, quitting now can help you:  Lead a longer and healthier life.  Look, smell, breathe, and feel better over time.  Save money.  Protect others from the harms of secondhand smoke. What actions can I take to prevent health problems? Quit smoking   Do not start smoking. Quit if you already do.  Make a plan to quit  smoking and commit to it. Look for programs to help you and ask your health care provider for recommendations and ideas.  Set a date and write down all the reasons you want to quit.  Let your friends and family know you are quitting so they can help and support you. Consider finding friends who also want to quit. It can be easier to quit with someone else, so that you can support each other.  Talk with your health care provider about using nicotine replacement medicines to help you quit, such as gum, lozenges, patches, sprays, or pills.  Do not replace cigarette smoking with electronic cigarettes, which are commonly called e-cigarettes. The safety of e-cigarettes is not known, and some may contain harmful chemicals.  If you try to quit but return to smoking, stay positive. It is common to slip up when you first quit, so take it one day at a time.  Be prepared for cravings. When you feel the urge to smoke, chew gum or  suck on hard candy. Lifestyle  Stay busy and take care of your body.  Drink enough fluid to keep your urine pale yellow.  Get plenty of exercise and eat a healthy diet. This can help prevent weight gain after quitting.  Monitor your eating habits. Quitting smoking can cause you to have a larger appetite than when you smoke.  Find ways to relax. Go out with friends or family to a movie or a restaurant where people do not smoke.  Ask your health care provider about having regular tests (screenings) to check for cancer. This may include blood tests, imaging tests, and other tests.  Find ways to manage your stress, such as meditation, yoga, or exercise. Where to find support To get support to quit smoking, consider:  Asking your health care provider for more information and resources.  Taking classes to learn more about quitting smoking.  Looking for local organizations that offer resources about quitting smoking.  Joining a support group for people who want to quit  smoking in your local community.  Calling the smokefree.gov counselor helpline: 1-800-Quit-Now 423-353-2344(1-3854943155) Where to find more information You may find more information about quitting smoking from:  HelpGuide.org: www.helpguide.org  BankRights.uySmokefree.gov: smokefree.gov  American Lung Association: www.lung.org Contact a health care provider if you:  Have problems breathing.  Notice that your lips, nose, or fingers turn blue.  Have chest pain.  Are coughing up blood.  Feel faint or you pass out.  Have other health changes that cause you to worry. Summary  Smoking tobacco can negatively affect your health, the health of those around you, your finances, and your social life.  Do not start smoking. Quit if you already do. If you need help quitting, ask your health care provider.  Think about joining a support group for people who want to quit smoking in your local community. There are many effective programs that will help you to quit this behavior. This information is not intended to replace advice given to you by your health care provider. Make sure you discuss any questions you have with your health care provider. Document Released: 06/22/2016 Document Revised: 07/27/2017 Document Reviewed: 06/22/2016 Elsevier Patient Education  2020 [employer]

## 2019-03-06 NOTE — Progress Notes (Signed)
Cardiology Clinic Note   Patient Name: FUMIO VANDAM Date of Encounter: 03/06/2019  Primary Care Provider:  Orpah Melter, MD Primary Cardiologist:  Peter Martinique, MD  Patient Profile    ERHARDT DADA 58 year old male presents today for follow-up status post STEMI with PCI and DES x1 to the RCA.  Past Medical History    Past Medical History:  Diagnosis Date  . DDD (degenerative disc disease), lumbar    followed by Dr Sherwood Gambler  . Diabetes ([place])   . Gout   . Hypercholesteremia   . Hyperlipidemia   . Hypertension   . Renal calculus or stone   . STEMI (ST elevation myocardial infarction) ([place])    02/26/19 PCI/DES to the RCA  . Tobacco abuse   . Type 2 diabetes mellitus with complication, without long-term current use of insulin ([place]) 02/27/2019   Past Surgical History:  Procedure Laterality Date  . APPENDECTOMY     20 years ago  . CORONARY STENT INTERVENTION N/A 02/26/2019   Procedure: CORONARY STENT INTERVENTION;  Surgeon: Martinique, Peter M, MD;  Location: [facility];  Service: Cardiovascular;  Laterality: N/A;  . CORONARY/GRAFT ACUTE MI REVASCULARIZATION N/A 02/26/2019   Procedure: Coronary/Graft Acute MI Revascularization;  Surgeon: Martinique, Peter M, MD;  Location: [facility];  Service: Cardiovascular;  Laterality: N/A;  . LEFT HEART CATH AND CORONARY ANGIOGRAPHY N/A 02/26/2019   Procedure: LEFT HEART CATH AND CORONARY ANGIOGRAPHY;  Surgeon: Martinique, Peter M, MD;  Location: [facility];  Service: Cardiovascular;  Laterality: N/A;    Allergies  No Known Allergies  History of Present Illness    Mr. Ellers underwent cardiac catheterization for his STEMI on 02/26/2019.  He received a DES x1 to his RCA.  He also had proximal to mid LAD lesion 20%, second marginal lesion 40%, distal circumflex 40%, and 40% stenosed sidebranch in LPAV.  On 02/25/2019 he developed substernal chest pain and tightness with radiation to his jaw and down his left arm.  He stated  the pain radiated at times but came and went throughout the day.  He had no shortness of breath,nausea, or diaphoresis.  He presented to the emergency department on the evening of 02/25/2019 after taking 325 mg aspirin at home. His EKG showed borderline ST elevation in anterior leads.  A STEMI was called he received 4000 units of heparin in the ED and received 1 sublingual nitro for his intermittent chest discomfort and was taken to the Cath Lab.  His PMH also includes essential hypertension, type 2 diabetes, generalized anxiety disorder, hyperlipidemia, tobacco abuse, dizziness, and depression.  He presents to the clinic today and states he feels well.  He has started to resume his daily activities and has been Engineer, manufacturing sites.  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  He states that he has noticed his blood pressure is been elevated in the 170s at home.  He has been taking his blood pressure 2-3 times per day.  Prior to his MI he was taking hydrochlorothiazide and lisinopril as well as amlodipine.  His amlodipine was continued.  I will restart his lisinopril at 10 mg today.  His right radial site is clean, dry, intact, and has no drainage.  He denies chest pain, shortness of breath, lower extremity edema, fatigue, palpitations, melena, hematuria, hemoptysis, diaphoresis, weakness, presyncope, syncope, orthopnea, and PND.   Home Medications    Prior to Admission medications  Medication Sig Start Date End Date Taking? Authorizing Provider  amLODipine (NORVASC) 10 MG tablet Take 10 mg by mouth daily. 10/12/18   [provider]  aspirin 81 MG chewable tablet Chew 1 tablet (81 mg total) by mouth daily. 02/28/19   Cheryln Manly, NP  atorvastatin (LIPITOR) 80 MG tablet Take 1 tablet (80 mg total) by mouth daily at 6 PM. 02/27/19   Cheryln Manly, NP  blood glucose meter kit and supplies Dispense based on patient and  insurance preference. Use up to four times daily as directed. (FOR ICD-10 E10.9, E11.9). 02/27/19   Cheryln Manly, NP  clonazePAM (KLONOPIN) 0.5 MG tablet Take 0.5-1 mg by mouth 2 (two) times daily as needed for anxiety.     [provider]  metFORMIN (GLUCOPHAGE) 500 MG tablet Take 1 tablet (500 mg total) by mouth 2 (two) times daily with a meal. 02/28/19   Reino Bellis B, NP  metoprolol succinate (TOPROL-XL) 50 MG 24 hr tablet Take 1 tablet (50 mg total) by mouth daily. Take with or immediately following a meal. 02/28/19   Cheryln Manly, NP  nitroGLYCERIN (NITROSTAT) 0.4 MG SL tablet Place 1 tablet (0.4 mg total) under the tongue every 5 (five) minutes x 3 doses as needed for chest pain. 02/27/19   Cheryln Manly, NP  pantoprazole (PROTONIX) 40 MG tablet Take 1 tablet (40 mg total) by mouth daily. Patient not taking: Reported on 02/26/2019 07/25/14   Thompson Grayer, MD  ticagrelor (BRILINTA) 90 MG TABS tablet Take 1 tablet (90 mg total) by mouth 2 (two) times daily. 02/27/19   Cheryln Manly, NP    Family History    Family History  Problem Relation Age of Onset  . Heart attack Father        father had multiple MIs, first MI age 75s, multiple paternal family members with CAD in their 59s  . Heart disease Father   . Stroke Father   . Cancer Mother   . Cancer Sister    He indicated that his mother is deceased. He indicated that his father is deceased. He indicated that only one of his two sisters is alive.  Social History    Social History   Socioeconomic History  . Marital status: Married    Spouse name: Not on file  . Number of children: 1  . Years of education: HS  . Highest education level: Not on file  Occupational History  . Occupation: Barista  . Financial resource strain: Not on file  . Food insecurity    Worry: Not on file    Inability: Not on file  . Transportation needs    Medical: Not on file    Non-medical: Not on file  Tobacco  Use  . Smoking status: Current Every Day Smoker    Packs/day: 1.00    Years: 20.00    Pack years: 20.00    Types: Cigarettes  . Smokeless tobacco: Never Used  Substance and Sexual Activity  . Alcohol use: Yes    Alcohol/week: 0.0 standard drinks    Comment: rare  . Drug use: No  . Sexual activity: Not on file  Lifestyle  . Physical activity    Days per week: Not on file    Minutes per session: Not on file  . Stress: Not on file  Relationships  . Social Herbalist on phone: Not on file    Gets together: Not on file  Attends religious service: Not on file    Active member of club or organization: Not on file    Attends meetings of clubs or organizations: Not on file    Relationship status: Not on file  . Intimate partner violence    Fear of current or ex partner: Not on file    Emotionally abused: Not on file    Physically abused: Not on file    Forced sexual activity: Not on file  Other Topics Concern  . Not on file  Social History Narrative   Lives in [place].  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   Patient is right handed.   Patient drinks 3- cups caffeine daily.     Review of Systems    General:  No chills, fever, night sweats or weight changes.  Cardiovascular:  No chest pain, dyspnea on exertion, edema, orthopnea, palpitations, paroxysmal nocturnal dyspnea. Dermatological: No rash, lesions/masses Respiratory: No cough, dyspnea Urologic: No hematuria, dysuria Abdominal:   No nausea, vomiting, diarrhea, bright red blood per rectum, melena, or hematemesis Neurologic:  No visual changes, wkns, changes in mental status. All other systems reviewed and are otherwise negative except as noted above.  Physical Exam    VS:  BP 136/70   Pulse (!) 57   Temp 98.2 F (36.8 C) (Temporal)   Ht 5' 11" (1.803 m)   Wt 240 lb (108.9 kg)   BMI 33.47 kg/m  , BMI Body mass index is 33.47 kg/m.  GEN: Well nourished, well developed, in no acute distress. HEENT: normal. Neck: Supple, no JVD, carotid bruits, or masses. Cardiac: RRR, no murmurs, rubs, or gallops. No clubbing, cyanosis, edema.  Radials/DP/PT 2+ and equal bilaterally.  Respiratory:  Respirations regular and unlabored, clear to auscultation bilaterally. GI: Soft, nontender, nondistended, BS + x 4. MS: no deformity or atrophy. Skin: warm and dry, no rash.  Right radial catheterization site dry, intact, no drainage. Neuro:  Strength and sensation are intact. Psych: Normal affect.  Accessory Clinical Findings    ECG personally reviewed by me today-sinus bradycardia 57 bpm- No acute changes  EKG on 03/11/2019 Sinus rhythm with incomplete left bundle branch block nonspecific ST abnormality 83 bpm  Echocardiogram 02/26/2019 IMPRESSIONS   1. The left ventricle has normal systolic function, with an ejection fraction of 55-60%. The cavity size was normal. There is moderately increased left ventricular wall thickness. Left ventricular diastolic Doppler parameters are consistent with  pseudonormalization.  2. There is mild mitral annular calcification present.  3. The aortic valve is tricuspid. Mild sclerosis of the aortic valve.  4. The aorta is normal unless otherwise noted.  5. The inferior vena cava was dilated in size with >50% respiratory variability.  Assessment & Plan   1.  STEMI-underwent cardiac catheterization for his STEMI on 02/26/2019.  He received a DES x1 to his RCA.  He also had proximal to mid LAD lesion 20%, second marginal lesion 40%, distal circumflex 40%, and 40% stenosed sidebranch in LPAV.  No chest pain today. Continue Brilinta 90 mg tablet twice daily Continue aspirin 81 mg tablet daily Continue amlodipine 10 mg tablet daily Continue metoprolol succinate 50 mg tablet daily Continue nitroglycerin 0.4 mg sublingual tablet as needed Continue atorvastatin 80 mg tablet daily Start lisinopril 10 mg tablet  daily at bedtime Heart healthy low-sodium diet Increase physical activity as tolerated Smoking cessation  2.  Essential hypertension- 136/70 today.  Blood pressures at home systolic 170s   Resume lisinopril 10 mg tablet daily at bedtime Continue amlodipine 10 mg tablet daily Continue metoprolol succinate 50 mg tablet daily Increase physical activity as tolerated Heart healthy low-sodium diet Keep blood pressure log Bring blood pressure cuff to next visit  3.  Hyperlipidemia- 02/25/2019: Cholesterol 247; HDL 34; LDL Cholesterol UNABLE TO CALCULATE IF TRIGLYCERIDE OVER 400 mg/dL; Triglycerides 718; VLDL UNABLE TO CALCULATE IF TRIGLYCERIDE OVER 400 mg/dL Continue atorvastatin 80 mg tablet daily Heart healthy low-sodium diet-increase fiber Increase physical activity as tolerated  4.   Type 2 diabetes- mean plasma glucose 154.2, A1c 7.0 Continue metformin 5 mg tablet twice daily Heart healthy/carb modified Followed by PCP  5.Tobacco abuse-patient states he has not smoked since leaving the hospital. Continue nicotine patch Smoking cessation-instructions given  Disposition: Follow-up with APP in 2 weeks  Jesse M Cleaver, NP-C 03/06/2019, 12:27 PM  

## 2019-03-06 NOTE — Patient Outreach (Signed)
[facility]) Care Management  03/06/2019  Timothy May 09/19/1960 [id number]  Transition of care call Referral received: 02/27/19 Initial outreach attempt: 02/28/19 Insurance: [place] unsuccessful telephone call to patient's preferred contact number in order to complete post hospital discharge transition of care assessment; no answer and unable to leave message as voice mailbox is full.  Objective:Per electronic medical record review,patient hospitalized 02/25/2019 -02/27/2019 for STEMI (ST elevation myocardial infarction). Patient also has a history of diabetes- most recent Hgb A1C= 7.0% on 02/26/19, hypertension, hyperlipidemia, and tobacco abuse.   Plan: Will close case to [place] Management services in 10 business days after initial post hospital discharge outreach, on 02/09/19.  Barrington Ellison RN,CCM,CDE [place] Management Coordinator Office Phone [phone] Office Fax [phone]

## 2019-03-07 ENCOUNTER — Encounter (HOSPITAL_COMMUNITY): Payer: Self-pay

## 2019-03-08 DIAGNOSIS — I10 Essential (primary) hypertension: Secondary | ICD-10-CM | POA: Diagnosis not present

## 2019-03-08 DIAGNOSIS — I2510 Atherosclerotic heart disease of native coronary artery without angina pectoris: Secondary | ICD-10-CM | POA: Diagnosis not present

## 2019-03-08 DIAGNOSIS — F419 Anxiety disorder, unspecified: Secondary | ICD-10-CM | POA: Diagnosis not present

## 2019-03-08 DIAGNOSIS — E1159 Type 2 diabetes mellitus with other circulatory complications: Secondary | ICD-10-CM | POA: Diagnosis not present

## 2019-03-08 DIAGNOSIS — Z23 Encounter for immunization: Secondary | ICD-10-CM | POA: Diagnosis not present

## 2019-03-08 DIAGNOSIS — E785 Hyperlipidemia, unspecified: Secondary | ICD-10-CM | POA: Diagnosis not present

## 2019-03-08 DIAGNOSIS — F17200 Nicotine dependence, unspecified, uncomplicated: Secondary | ICD-10-CM | POA: Diagnosis not present

## 2019-03-12 ENCOUNTER — Other Ambulatory Visit: Payer: Self-pay | Admitting: *Deleted

## 2019-03-12 NOTE — Patient Outreach (Signed)
[facility]) Care Management  03/12/2019  Timothy May 04-06-1961 [id number]  Transition of Care Case Closure- Unsuccessful Outreach Referral received: 02/27/19 Initial outreach attempt: 02/28/19 Insurance: [place] Choice Plan  Objective: Per electronic medical record review,patient hospitalized at [facility] from 02/25/2019 -02/27/2019 for STEMI (ST elevation myocardial infarction). Patient also has a history of diabetes- most recent Hgb A1C= 7.0% on 02/26/19, hypertension, hyperlipidemia, and tobacco abuse.   Assessment: Unable to complete post hospital discharge transition of care assessment; unable to leave message due to patient's mobile phone voice mailbox being full and no response to request to contact this RNCM in unsuccessful outreach letter mailed to patient's home on   02/28/19.   Plan: Case closed to [place] Management services as it has been 10 business days since initial post hospital discharge outreach attempt.  Barrington Ellison RN,CCM,CDE [place] Management Coordinator Office Phone [phone] Office Fax [phone]

## 2019-03-20 ENCOUNTER — Encounter: Payer: Self-pay | Admitting: Physician Assistant

## 2019-03-20 ENCOUNTER — Other Ambulatory Visit: Payer: Self-pay

## 2019-03-20 ENCOUNTER — Ambulatory Visit: Payer: 59 | Admitting: General Practice

## 2019-03-20 VITALS — BP 138/72 | HR 64 | Temp 97.2°F | Ht 71.0 in | Wt 234.8 lb

## 2019-03-20 DIAGNOSIS — E118 Type 2 diabetes mellitus with unspecified complications: Secondary | ICD-10-CM | POA: Diagnosis not present

## 2019-03-20 DIAGNOSIS — I2510 Atherosclerotic heart disease of native coronary artery without angina pectoris: Secondary | ICD-10-CM

## 2019-03-20 DIAGNOSIS — I10 Essential (primary) hypertension: Secondary | ICD-10-CM

## 2019-03-20 DIAGNOSIS — E7800 Pure hypercholesterolemia, unspecified: Secondary | ICD-10-CM | POA: Diagnosis not present

## 2019-03-20 MED ORDER — LISINOPRIL 20 MG PO TABS: 20.0000 mg | ORAL_TABLET | Freq: Every day | ORAL | 1 refills | Status: DC

## 2019-03-20 NOTE — Patient Instructions (Addendum)
Medication Instructions:   INCREASE Lisinopril to 20 mg daily If you need a refill on your cardiac medications before your next appointment, please call your pharmacy.   Lab work: You will need to have labs (blood work) drawn in 1 week. Can be done at a [facility]:  BMET If you have labs (blood work) drawn today and your tests are completely normal, you will receive your results only by: [place] Kitchen MyChart Message (if you have MyChart) OR . A paper copy in the mail If you have any lab test that is abnormal or we need to change your treatment, we will call you to review the results.  Testing/Procedures: NONE ordered at this time of appointment   Follow-Up: At [facility], you and your health needs are our priority.  As part of our continuing mission to provide you with exceptional heart care, we have created designated Provider Care Teams.  These Care Teams include your primary Cardiologist (physician) and Advanced Practice Providers (APPs -  Physician Assistants and Nurse Practitioners) who all work together to provide you with the care you need, when you need it. . Your physician recommends that you schedule a follow-up Virtual appointment in 1 week with Coletta Memos   Any Other Special Instructions Will Be Listed Below (If Applicable).

## 2019-03-20 NOTE — Progress Notes (Signed)
Cardiology Clinic Note   Patient Name: Timothy May Date of Encounter: 03/20/2019  Primary Care Provider:  Orpah Melter, MD Primary Cardiologist:  Peter Martinique, MD  Patient Profile    Timothy May 58 year old male presents today for follow-up status post STEMI with PCI and DES x1 to the RCA and blood pressure management.  Past Medical History    Past Medical History:  Diagnosis Date  . DDD (degenerative disc disease), lumbar    followed by Dr Sherwood Gambler  . Diabetes ([place])   . Gout   . Hypercholesteremia   . Hyperlipidemia   . Hypertension   . Renal calculus or stone   . STEMI (ST elevation myocardial infarction) ([place])    02/26/19 PCI/DES to the RCA  . Tobacco abuse   . Type 2 diabetes mellitus with complication, without long-term current use of insulin ([place]) 02/27/2019   Past Surgical History:  Procedure Laterality Date  . APPENDECTOMY     20 years ago  . CORONARY STENT INTERVENTION N/A 02/26/2019   Procedure: CORONARY STENT INTERVENTION;  Surgeon: Martinique, Peter M, MD;  Location: [place] [facility];  Service: Cardiovascular;  Laterality: N/A;  . CORONARY/GRAFT ACUTE MI REVASCULARIZATION N/A 02/26/2019   Procedure: Coronary/Graft Acute MI Revascularization;  Surgeon: Martinique, Peter M, MD;  Location: [place] [facility];  Service: Cardiovascular;  Laterality: N/A;  . LEFT HEART CATH AND CORONARY ANGIOGRAPHY N/A 02/26/2019   Procedure: LEFT HEART CATH AND CORONARY ANGIOGRAPHY;  Surgeon: Martinique, Peter M, MD;  Location: [place] [facility];  Service: Cardiovascular;  Laterality: N/A;    Allergies  No Known Allergies  History of Present Illness    Timothy May was last seen by me on 03/06/2019.  During that time he was recovering well from his STEMI on 02/26/2019.  He received a DES x1 to his RCA.  He had proximal to mid LAD lesion that was 20%, second marginal lesion 40%, distal circumflex 40% and 40% stenosed sidebranch in LPA V.  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  Prior to his MI he was taking hydrochlorothiazide and lisinopril as well as amlodipine.  His amlodipine had been discontinued and I restarted his lisinopril at 10 mg daily.  He presents today for a repeat blood pressure check.  His PMH also includes essential hypertension, type 2 diabetes, generalized anxiety disorder, hyperlipidemia, tobacco abuse, dizziness, and depression  He presents the clinic today and states he is back to work however, he has been working nights and he feels much more tired.  He states that he has felt somewhat tired since his stent on 02/26/2019.  He asks if some of his medication may be causing his tiredness.  Upon review new medications for him are his Toprol XL and Brilinta.  He was instructed to continue to take these medications due to the fact that he is only weeks away from his STEMI and their benefit was explained.  He expressed understanding and stated he would continue the medications.  He has been monitoring his blood pressures at home and they are somewhat better controlled however still above the 130/80 goal.  I will titrate up his lisinopril today have repeat labs drawn in 1 week and evaluate his hypertension in 1 week as well.  He denies chest pain, shortness of breath, lower extremity edema, fatigue, palpitations, melena, hematuria, hemoptysis, diaphoresis, weakness, presyncope, syncope, orthopnea, and PND.   Home Medications    Prior to Admission medications  Medication Sig Start Date End Date Taking? Authorizing Provider  amLODipine (NORVASC) 10 MG tablet Take 10 mg by mouth daily. 10/12/18   [provider]  aspirin 81 MG chewable tablet Chew 1 tablet (81 mg total) by mouth daily. 02/28/19   Cheryln Manly, NP  atorvastatin (LIPITOR) 80 MG tablet Take 1 tablet (80 mg total) by mouth daily at 6 PM. 02/27/19   Cheryln Manly, NP  blood glucose meter kit and supplies Dispense based on patient and insurance preference.  Use up to four times daily as directed. (FOR ICD-10 E10.9, E11.9). 02/27/19   Cheryln Manly, NP  clonazePAM (KLONOPIN) 0.5 MG tablet Take 0.5-1 mg by mouth 2 (two) times daily as needed for anxiety.     [provider]  lisinopril (ZESTRIL) 10 MG tablet Take 1 tablet (10 mg total) by mouth daily. 03/06/19 06/04/19  Deberah Pelton, NP  metFORMIN (GLUCOPHAGE) 500 MG tablet Take 1 tablet (500 mg total) by mouth 2 (two) times daily with a meal. 02/28/19   Cheryln Manly, NP  metoprolol succinate (TOPROL-XL) 50 MG 24 hr tablet Take 1 tablet (50 mg total) by mouth daily. Take with or immediately following a meal. 02/28/19   Cheryln Manly, NP  nitroGLYCERIN (NITROSTAT) 0.4 MG SL tablet Place 1 tablet (0.4 mg total) under the tongue every 5 (five) minutes x 3 doses as needed for chest pain. 02/27/19   Cheryln Manly, NP  pantoprazole (PROTONIX) 40 MG tablet Take 1 tablet (40 mg total) by mouth daily. 07/25/14   Allred, Jeneen Rinks, MD  ticagrelor (BRILINTA) 90 MG TABS tablet Take 1 tablet (90 mg total) by mouth 2 (two) times daily. 02/27/19   Cheryln Manly, NP    Family History    Family History  Problem Relation Age of Onset  . Heart attack Father        father had multiple MIs, first MI age 43s, multiple paternal family members with CAD in their 43s  . Heart disease Father   . Stroke Father   . Cancer Mother   . Cancer Sister    He indicated that his mother is deceased. He indicated that his father is deceased. He indicated that only one of his two sisters is alive.  Social History    Social History   Socioeconomic History  . Marital status: Married    Spouse name: Not on file  . Number of children: 1  . Years of education: HS  . Highest education level: Not on file  Occupational History  . Occupation: Barista  . Financial resource strain: Not on file  . Food insecurity    Worry: Not on file    Inability: Not on file  . Transportation needs     Medical: Not on file    Non-medical: Not on file  Tobacco Use  . Smoking status: Current Every Day Smoker    Packs/day: 1.00    Years: 20.00    Pack years: 20.00    Types: Cigarettes  . Smokeless tobacco: Never Used  Substance and Sexual Activity  . Alcohol use: Yes    Alcohol/week: 0.0 standard drinks    Comment: rare  . Drug use: No  . Sexual activity: Not on file  Lifestyle  . Physical activity    Days per week: Not on file    Minutes per session: Not on file  . Stress: Not on file  Relationships  . Social connections  Talks on phone: Not on file    Gets together: Not on file    Attends religious service: Not on file    Active member of club or organization: Not on file    Attends meetings of clubs or organizations: Not on file    Relationship status: Not on file  . Intimate partner violence    Fear of current or ex partner: Not on file    Emotionally abused: Not on file    Physically abused: Not on file    Forced sexual activity: Not on file  Other Topics Concern  . Not on file  Social History Narrative   Lives in [place].  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   Patient is right handed.   Patient drinks 3- cups caffeine daily.     Review of Systems    General:  No chills, fever, night sweats or weight changes.  Cardiovascular:  No chest pain, dyspnea on exertion, edema, orthopnea, palpitations, paroxysmal nocturnal dyspnea. Dermatological: No rash, lesions/masses Respiratory: No cough, dyspnea Urologic: No hematuria, dysuria Abdominal:   No nausea, vomiting, diarrhea, bright red blood per rectum, melena, or hematemesis Neurologic:  No visual changes, wkns, changes in mental status. All other systems reviewed and are otherwise negative except as noted above.  Physical Exam    VS:  BP 138/72   Pulse 64   Temp (!) 97.2 F (36.2 C)   Ht 5' 11" (1.803 m)   Wt 234 lb 12.8 oz (106.5 kg)    SpO2 96%   BMI 32.75 kg/m  , BMI Body mass index is 32.75 kg/m. GEN: Well nourished, well developed, in no acute distress. HEENT: normal. Neck: Supple, no JVD, carotid bruits, or masses. Cardiac: RRR, no murmurs, rubs, or gallops. No clubbing, cyanosis, edema.  Radials/DP/PT 2+ and equal bilaterally.  Respiratory:  Respirations regular and unlabored, clear to auscultation bilaterally. GI: Soft, nontender, nondistended, BS + x 4. MS: no deformity or atrophy. Skin: warm and dry, no rash. Neuro:  Strength and sensation are intact. Psych: Normal affect.  Accessory Clinical Findings    ECG personally reviewed by me today-no EKG today.  EKG 03/06/2019  sinus bradycardia 57 bpm- No acute changes  EKG on 03/11/2019 Sinus rhythm with incomplete left bundle branch block nonspecific ST abnormality 83 bpm  Echocardiogram 02/26/2019 IMPRESSIONS  1. The left ventricle has normal systolic function, with an ejection fraction of 55-60%. The cavity size was normal. There is moderately increased left ventricular wall thickness. Left ventricular diastolic Doppler parameters are consistent with  pseudonormalization. 2. There is mild mitral annular calcification present. 3. The aortic valve is tricuspid. Mild sclerosis of the aortic valve. 4. The aorta is normal unless otherwise noted. 5. The inferior vena cava was dilated in size with >50% respiratory variability.    Assessment & Plan   Essential hypertension- 138/72 today.  Blood pressures at home systolic 008'Q Increase lisinopril to 20 mg tablet daily at bedtime Continue amlodipine 10 mg tablet daily Continue metoprolol succinate 50 mg tablet daily Increase physical activity as tolerated Heart healthy low-sodium diet Keep blood pressure log Bring blood pressure cuff to next visit Order BMP for 1 week- patient can have drawn at [facility] in his area   Hyperlipidemia- 02/25/2019: Cholesterol 247; HDL 34; LDL Cholesterol UNABLE TO  CALCULATE IF TRIGLYCERIDE OVER 400 mg/dL; Triglycerides 718; VLDL UNABLE TO CALCULATE IF TRIGLYCERIDE OVER 400 mg/dL Continue atorvastatin 80 mg tablet daily  Heart healthy low-sodium diet-increase fiber Increase physical activity as tolerated Recheck lipid panel in 6 weeks  Type 2 diabetes- mean plasma glucose 154.2, A1c 7.0 Continue metformin 5 mg tablet twice daily Heart healthy/carb modified Followed by PCP  CAD-underwent cardiac catheterization for his STEMI on 02/26/2019.  He received a DES x1 to his RCA.  He also had proximal to mid LAD lesion 20%, second marginal lesion 40%, distal circumflex 40%, and 40% stenosed sidebranch in LPAV.  No chest pain today. Continue Brilinta 90 mg tablet twice daily Continue aspirin 81 mg tablet daily Continue amlodipine 10 mg tablet daily Continue metoprolol succinate 50 mg tablet daily Continue nitroglycerin 0.4 mg sublingual tablet as needed Continue atorvastatin 80 mg tablet daily Heart healthy low-sodium diet Increase physical activity as tolerated  Disposition: Follow-up with me virtually in 1 week.  Deberah Pelton, NP-C 03/20/2019, 11:35 AM

## 2019-03-22 ENCOUNTER — Telehealth (HOSPITAL_COMMUNITY): Payer: Self-pay

## 2019-03-22 NOTE — Telephone Encounter (Signed)
No response from pt regarding CR.  Closed referral.  

## 2019-03-26 DIAGNOSIS — E7800 Pure hypercholesterolemia, unspecified: Secondary | ICD-10-CM | POA: Diagnosis not present

## 2019-03-26 MED FILL — BRILINTA 90 MG TABLET: 90 | 30 days supply | Qty: 60 | Fill #0

## 2019-03-26 MED FILL — metFORMIN HCL 500 MG TABS: 500 | 30 days supply | Qty: 60 | Fill #0

## 2019-03-27 ENCOUNTER — Telehealth: Payer: Self-pay

## 2019-03-27 LAB — BASIC METABOLIC PANEL
BUN/Creatinine Ratio: 19 (ref 9–20)
BUN: 17 mg/dL (ref 6–24)
CO2: 26 mmol/L (ref 20–29)
Calcium: 10.1 mg/dL (ref 8.7–10.2)
Chloride: 100 mmol/L (ref 96–106)
Creatinine, Ser: 0.89 mg/dL (ref 0.76–1.27)
GFR calc Af Amer: 110 mL/min/{1.73_m2} (ref >59)
GFR calc non Af Amer: 96 mL/min/{1.73_m2} (ref >59)
Glucose: 119 mg/dL — ABNORMAL HIGH (ref 65–99)
Potassium: 4.1 mmol/L (ref 3.5–5.2)
Sodium: 139 mmol/L (ref 134–144)

## 2019-03-27 NOTE — Telephone Encounter (Addendum)
Left a detailed voice message for the patient per DPR on file and asked that if he had any questions about the results to give our office a call. Will try calling patient again.   ----- Message from Deberah Pelton, NP sent at 03/27/2019  7:26 AM EDT ----- Please call Mr. Bera and let him know that his lab work that was drawn on 03/26/2019 is normal. Thank you

## 2019-03-28 ENCOUNTER — Encounter: Payer: Self-pay | Admitting: General Practice

## 2019-03-28 ENCOUNTER — Telehealth (INDEPENDENT_AMBULATORY_CARE_PROVIDER_SITE_OTHER): Payer: 59 | Admitting: General Practice

## 2019-03-28 VITALS — BP 144/74 | HR 63 | Ht 71.0 in | Wt 232.0 lb

## 2019-03-28 DIAGNOSIS — E7800 Pure hypercholesterolemia, unspecified: Secondary | ICD-10-CM

## 2019-03-28 DIAGNOSIS — I2510 Atherosclerotic heart disease of native coronary artery without angina pectoris: Secondary | ICD-10-CM | POA: Diagnosis not present

## 2019-03-28 DIAGNOSIS — E118 Type 2 diabetes mellitus with unspecified complications: Secondary | ICD-10-CM

## 2019-03-28 DIAGNOSIS — I10 Essential (primary) hypertension: Secondary | ICD-10-CM

## 2019-03-28 MED ORDER — LISINOPRIL 30 MG PO TABS: 30.0000 mg | ORAL_TABLET | Freq: Every day | ORAL | 2 refills | Status: DC

## 2019-03-28 MED FILL — LISINOPRIL 30 MG TABS: 30 | 90 days supply | Qty: 90 | Fill #0

## 2019-03-28 NOTE — Patient Instructions (Addendum)
Medication Instructions:  INCREASE- Lisinopril 30 mg by mouth daily  If you need a refill on your cardiac medications before your next appointment, please call your pharmacy.  Labwork: BMP in 1 week HERE IN OUR OFFICE AT [facility] You will NOT need to fast   Take the provided lab slips with you to the lab for your blood draw.   When you have your labs (blood work) drawn today and your tests are completely normal, you will receive your results only by MyChart Message (if you have MyChart) -OR-  A paper copy in the mail.  If you have any lab test that is abnormal or we need to change your treatment, we will call you to review these results.  Testing/Procedures: None Ordered  Follow-Up: . Your physician recommends that you schedule a follow-up appointment in: 2 weeks virtual visit October 21st @ 11:15 am   At [facility], you and your health needs are our priority.  As part of our continuing mission to provide you with exceptional heart care, we have created designated Provider Care Teams.  These Care Teams include your primary Cardiologist (physician) and Advanced Practice Providers (APPs -  Physician Assistants and Nurse Practitioners) who all work together to provide you with the care you need, when you need it.  Thank you for choosing [facility] at [facility]!!

## 2019-03-28 NOTE — Progress Notes (Signed)
Virtual Visit via Video Note   This visit type was conducted due to national recommendations for restrictions regarding the COVID-19 Pandemic (e.g. social distancing) in an effort to limit this patient's exposure and mitigate transmission in our community.  Due to his co-morbid illnesses, this patient is at least at moderate risk for complications without adequate follow up.  This format is felt to be most appropriate for this patient at this time.  All issues noted in this document were discussed and addressed.  A limited physical exam was performed with this format.  Please refer to the patient's chart for his consent to telehealth for [facility].  Evaluation Performed:  Follow-up visit  This visit type was conducted due to national recommendations for restrictions regarding the COVID-19 Pandemic (e.g. social distancing).  This format is felt to be most appropriate for this patient at this time.  All issues noted in this document were discussed and addressed.  No physical exam was performed (except for noted visual exam findings with Video Visits).  Please refer to the patient's chart (MyChart message for video visits and phone note for telephone visits) for the patient's consent to telehealth for [facility]  Date:  03/28/2019   ID:  Timothy May, DOB 06-20-1961, MRN [id number]  Patient Location:  [address] [place] [address]   Provider location:   [place] [address] Office [phone] Fax [phone]   PCP:  Orpah Melter, MD  Cardiologist:  Peter Martinique, MD  Electrophysiologist:  None   Chief Complaint: Hypertension  History of Present Illness:    Timothy May is a 58 y.o. male who presents via audio/video conferencing for a telehealth visit today.  Patient verified DOB and address.  He is being seen today in follow-up for blood pressure management.Timothy May was last seen by me on 03/20/2019.   During that time he continued to do well from his STEMI on 02/26/2019.  He had underwent cardiac catheterization andreceived a DES x1 to his RCA.  He had proximal to mid LAD lesion that was 20%, second marginal lesion 40%, distal circumflex 40% and 40% stenosed sidebranch in LPA V.  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  Prior to his MI he was taking hydrochlorothiazide and lisinopril as well as amlodipine.  His amlodipine had been discontinued and I restarted his lisinopril at 10 mg daily and titrated it to 20 mg daily.  He presents today for a repeat blood pressure check.  His PMH also includes essential hypertension, type 2 diabetes, generalized anxiety disorder, hyperlipidemia, tobacco abuse, dizziness, and depression  He is seen virtually today and states he has 1 more week working night shift before he returns to his normal schedule.  He feels well today.  He has been monitoring his blood pressure and it continues to be in the 706C to 376E systolic.  I will again titrate up his lisinopril today have repeat labs drawn in 1 week and evaluate his hypertension in 2 weeks.  He denies chest pain, shortness of breath, lower extremity edema, fatigue, palpitations, melena, hematuria, hemoptysis, diaphoresis, weakness, presyncope, syncope, orthopnea, and PND.   The patient does not symptoms concerning for COVID-19 infection (fever, chills, cough, or new SHORTNESS OF BREATH).    Prior CV studies:   The following studies were reviewed today:  EKG 03/06/2019  sinus bradycardia 57 bpm- No acute changes  EKG on 03/11/2019  Sinus rhythm with incomplete left bundle branch block nonspecific ST abnormality 83 bpm  Echocardiogram 02/26/2019 IMPRESSIONS  1. The left ventricle has normal systolic function, with an ejection fraction of 55-60%. The cavity size was normal. There is moderately increased left ventricular wall thickness. Left ventricular diastolic Doppler  parameters are consistent with  pseudonormalization. 2. There is mild mitral annular calcification present. 3. The aortic valve is tricuspid. Mild sclerosis of the aortic valve. 4. The aorta is normal unless otherwise noted. 5. The inferior vena cava was dilated in size with >50% respiratory variability.   Past Medical History:  Diagnosis Date  . DDD (degenerative disc disease), lumbar    followed by Dr Sherwood Gambler  . Diabetes ([place])   . Gout   . Hypercholesteremia   . Hyperlipidemia   . Hypertension   . Renal calculus or stone   . STEMI (ST elevation myocardial infarction) ([place])    02/26/19 PCI/DES to the RCA  . Tobacco abuse   . Type 2 diabetes mellitus with complication, without long-term current use of insulin ([place]) 02/27/2019   Past Surgical History:  Procedure Laterality Date  . APPENDECTOMY     20 years ago  . CORONARY STENT INTERVENTION N/A 02/26/2019   Procedure: CORONARY STENT INTERVENTION;  Surgeon: Martinique, Peter M, MD;  Location: [place] [facility];  Service: Cardiovascular;  Laterality: N/A;  . CORONARY/GRAFT ACUTE MI REVASCULARIZATION N/A 02/26/2019   Procedure: Coronary/Graft Acute MI Revascularization;  Surgeon: Martinique, Peter M, MD;  Location: [place] [facility];  Service: Cardiovascular;  Laterality: N/A;  . LEFT HEART CATH AND CORONARY ANGIOGRAPHY N/A 02/26/2019   Procedure: LEFT HEART CATH AND CORONARY ANGIOGRAPHY;  Surgeon: Martinique, Peter M, MD;  Location: [place] [facility];  Service: Cardiovascular;  Laterality: N/A;     Current Meds  Medication Sig  . amLODipine (NORVASC) 10 MG tablet Take 10 mg by mouth daily.  Marland Kitchen aspirin 81 MG chewable tablet Chew 1 tablet (81 mg total) by mouth daily.  Marland Kitchen atorvastatin (LIPITOR) 80 MG tablet Take 1 tablet (80 mg total) by mouth daily at 6 PM.  . blood glucose meter kit and supplies Dispense based on patient and insurance preference. Use up to four times daily as directed. (FOR ICD-10 E10.9, E11.9).  . clonazePAM (KLONOPIN) 0.5 MG  tablet Take 0.5-1 mg by mouth 2 (two) times daily as needed for anxiety.   Marland Kitchen lisinopril (ZESTRIL) 30 MG tablet Take 1 tablet (30 mg total) by mouth daily.  . metFORMIN (GLUCOPHAGE) 500 MG tablet Take 1 tablet (500 mg total) by mouth 2 (two) times daily with a meal.  . metoprolol succinate (TOPROL-XL) 50 MG 24 hr tablet Take 1 tablet (50 mg total) by mouth daily. Take with or immediately following a meal.  . nitroGLYCERIN (NITROSTAT) 0.4 MG SL tablet Place 1 tablet (0.4 mg total) under the tongue every 5 (five) minutes x 3 doses as needed for chest pain.  . ticagrelor (BRILINTA) 90 MG TABS tablet Take 1 tablet (90 mg total) by mouth 2 (two) times daily.  . [DISCONTINUED] lisinopril (ZESTRIL) 20 MG tablet Take 1 tablet (20 mg total) by mouth daily.     Allergies:   Patient has no known allergies.   Social History   Tobacco Use  . Smoking status: Current Every Day Smoker    Packs/day: 1.00    Years: 20.00    Pack years: 20.00    Types: Cigarettes  . Smokeless tobacco: Never Used  Substance Use Topics  .  Alcohol use: Yes    Alcohol/week: 0.0 standard drinks    Comment: rare  . Drug use: No     Family Hx: The patient's family history includes Cancer in his mother and sister; Heart attack in his father; Heart disease in his father; Stroke in his father.  ROS:   Please see the history of present illness.     All other systems reviewed and are negative.   Labs/Other Tests and Data Reviewed:    Recent Labs: 02/26/2019: B Natriuretic Peptide 28.7; Hemoglobin 13.6; Platelets 127; TSH 2.862 03/26/2019: BUN 17; Creatinine, Ser 0.89; Potassium 4.1; Sodium 139   Recent Lipid Panel Lab Results  Component Value Date/Time   CHOL 247 (H) 02/25/2019 11:37 PM   TRIG 718 (H) 02/25/2019 11:37 PM   HDL 34 (L) 02/25/2019 11:37 PM   CHOLHDL 7.3 02/25/2019 11:37 PM   LDLCALC UNABLE TO CALCULATE IF TRIGLYCERIDE OVER 400 mg/dL 02/25/2019 11:37 PM   LDLDIRECT 146.7 (H) 02/25/2019 11:37 PM    Wt  Readings from Last 3 Encounters:  03/28/19 232 lb (105.2 kg)  03/20/19 234 lb 12.8 oz (106.5 kg)  03/06/19 240 lb (108.9 kg)     Exam:    Vital Signs:  BP (!) 144/74   Pulse 63   Ht _0  (1.803 m)   Wt 232 lb (105.2 kg)   BMI 32.36 kg/m    Well nourished, well developed male in no  acute distress.   ASSESSMENT & PLAN:    Essential hypertension-144/74 today. Blood pressures at home systolic 716'R-678'L Increase lisinopril to 30 mg tablet daily at bedtime Continue amlodipine 10 mg tablet daily Continue metoprolol succinate44m tablet daily Increase physical activity as tolerated Heart healthy low-sodium diet-salty 6 placed in mail for him to review and apply Keep blood pressure log Bring blood pressure cuff to next visit Order BMP for 1 week- patient can have drawn at [facility] again in his area  Hyperlipidemia-02/25/2019: Cholesterol 247; HDL 34; LDL Cholesterol UNABLE TO CALCULATE IF TRIGLYCERIDE OVER 400 mg/dL; Triglycerides 718; VLDL UNABLE TO CALCULATE IF TRIGLYCERIDE OVER 400 mg/dL Continue atorvastatin 80 mg tablet daily Heart healthy low-sodium diet-increase fiber Increase physical activity as tolerated Recheck lipid panel in 6 weeks  Type 2 diabetes-mean plasma glucose 154.2,A1c 7.0 Continue metformin 5 mg tablet twice daily Heart healthy/carb modified Followed by PCP  CAD-underwent cardiac catheterization for his STEMI on 02/26/2019. He received a DES x1 to his RCA. He also had proximal to mid LAD lesion 20%, second marginal lesion 40%, distal circumflex 40%,and 40% stenosed sidebranch in LPAV.No chest pain today. Continue Brilinta 90 mg tablet twice daily Continue aspirin 81 mg tablet daily Continue amlodipine 10 mg tablet daily Increase lisinopril to 30 mg tablet daily at bedtime Continue metoprolol succinate 50 mg tablet daily Continue nitroglycerin 0.4 mg sublingual tablet as needed Continue atorvastatin 80 mg tablet daily Heart healthy  low-sodium diet Increase physical activity as tolerated  Disposition: Follow-up with me virtually in 2 week.  COVID-19 Education: The signs and symptoms of COVID-19 were discussed with the patient and how to seek care for testing (follow up with PCP or arrange E-visit).  The importance of social distancing was discussed today.  Patient Risk:   After full review of this patients clinical status, I feel that they are at least moderate risk at this time.  Time:   Today, I have spent 16 minutes with the patient with telehealth technology discussing HTN.     Medication Adjustments/Labs and Tests  Ordered: Current medicines are reviewed at length with the patient today.  Concerns regarding medicines are outlined above.   Tests Ordered: Orders Placed This Encounter  Procedures  . Basic Metabolic Panel (BMET)   Medication Changes: Meds ordered this encounter  Medications  . lisinopril (ZESTRIL) 30 MG tablet    Sig: Take 1 tablet (30 mg total) by mouth daily.    Dispense:  90 tablet    Refill:  2    Disposition:  in 2 week(s)  Signed, Deberah Pelton, NP -C 03/28/2019 12:12 PM

## 2019-04-04 DIAGNOSIS — I10 Essential (primary) hypertension: Secondary | ICD-10-CM | POA: Diagnosis not present

## 2019-04-04 LAB — BASIC METABOLIC PANEL
BUN/Creatinine Ratio: 13 (ref 9–20)
BUN: 15 mg/dL (ref 6–24)
CO2: 22 mmol/L (ref 20–29)
Calcium: 9.6 mg/dL (ref 8.7–10.2)
Chloride: 102 mmol/L (ref 96–106)
Creatinine, Ser: 1.18 mg/dL (ref 0.76–1.27)
GFR calc Af Amer: 79 mL/min/{1.73_m2} (ref >59)
GFR calc non Af Amer: 69 mL/min/{1.73_m2} (ref >59)
Glucose: 108 mg/dL — ABNORMAL HIGH (ref 65–99)
Potassium: 4.5 mmol/L (ref 3.5–5.2)
Sodium: 139 mmol/L (ref 134–144)

## 2019-04-10 NOTE — Progress Notes (Signed)
Virtual Visit via Video Note   This visit type was conducted due to national recommendations for restrictions regarding the COVID-19 Pandemic (e.g. social distancing) in an effort to limit this patient's exposure and mitigate transmission in our community.  Due to his co-morbid illnesses, this patient is at least at moderate risk for complications without adequate follow up.  This format is felt to be most appropriate for this patient at this time.  All issues noted in this document were discussed and addressed.  A limited physical exam was performed with this format.  Please refer to the patient's chart for his consent to telehealth for Timothy May.  Evaluation Performed:  Follow-up visit  This visit type was conducted due to national recommendations for restrictions regarding the COVID-19 Pandemic (e.g. social distancing).  This format is felt to be most appropriate for this patient at this time.  All issues noted in this document were discussed and addressed.  No physical exam was performed (except for noted visual exam findings with Video Visits).  Please refer to the patient's chart (MyChart message for video visits and phone note for telephone visits) for the patient's consent to telehealth for Timothy May  Date:  04/11/2019   ID:  [name], DOB January 18, 1961, MRN [id number]  Patient Location:  [address] [place] [address]   Provider location:   [place] [address] Office [phone] Fax [phone]   PCP:  Orpah Melter, MD  Cardiologist:  Peter Martinique, MD Electrophysiologist:  None   Chief Complaint: Hypertension  History of Present Illness:    Timothy NABOR is a 58 y.o. male who presents via audio/video conferencing for a telehealth visit today.  Patient verified DOB and address.  His prior medical history includes essential hypertension, type 2 diabetes, generalized anxiety disorder,  hyperlipidemia, tobacco abuse, dizziness and depression.  He is being seen today in follow-up for hypertension management.  He was last seen by me on 03/28/2019.  During that time he was transitioning from working night shift to day shift.  His blood pressure continued to be in the 001V to 494W systolic.  On 02/26/2019 he underwent cardiac catheterization and received a DES x1 to his RCA.  He continued to be chest pain-free, had no shortness of breath, and denied palpitations dizziness and weakness.  He is seen virtually today and states he feels well.  He is back to working on day shift, doing his Garment/textile technologist.  He states that since he has moved back to working a more regular day shift schedule his fatigue has gone away.  He states that his blood pressures continue to be in the 967R systolic.  He states that he is wondering if his blood pressure cuff is accurate.  I have instructed him to bring his blood pressure cuff along with his log to his next May appointment.  I will titrate his lisinopril up again today and schedule an appointment with the cardiology pharmacy team for further evaluation.  He will repeat his lab work (BMP) during his pharmacy visit.  He denies chest pain, shortness of breath, lower extremity edema, fatigue, palpitations, melena, hematuria, hemoptysis, diaphoresis, weakness, presyncope, syncope, orthopnea, and PND.  The patient does not symptoms concerning for COVID-19 infection (fever, chills, cough, or new SHORTNESS OF BREATH).    Prior CV studies:   The following studies were reviewed today:  EKG 03/06/2019  sinus bradycardia 57 bpm- No acute changes  EKG on 03/11/2019 Sinus rhythm with incomplete left bundle branch block nonspecific ST abnormality 83 bpm  Echocardiogram 02/26/2019 IMPRESSIONS  1. The left ventricle has normal systolic function, with an ejection fraction of 55-60%. The cavity size was normal. There is moderately increased left  ventricular wall thickness. Left ventricular diastolic Doppler parameters are consistent with  pseudonormalization. 2. There is mild mitral annular calcification present. 3. The aortic valve is tricuspid. Mild sclerosis of the aortic valve. 4. The aorta is normal unless otherwise noted. 5. The inferior vena cava was dilated in size with >50% respiratory variability.  Past Medical History:  Diagnosis Date  . DDD (degenerative disc disease), lumbar    followed by Dr Sherwood Gambler  . Diabetes ([place])   . Gout   . Hypercholesteremia   . Hyperlipidemia   . Hypertension   . Renal calculus or stone   . STEMI (ST elevation myocardial infarction) ([place])    02/26/19 PCI/DES to the RCA  . Tobacco abuse   . Type 2 diabetes mellitus with complication, without long-term current use of insulin ([place]) 02/27/2019   Past Surgical History:  Procedure Laterality Date  . APPENDECTOMY     20 years ago  . CORONARY STENT INTERVENTION N/A 02/26/2019   Procedure: CORONARY STENT INTERVENTION;  Surgeon: Martinique, Peter M, MD;  Location: [place] [facility];  Service: Cardiovascular;  Laterality: N/A;  . CORONARY/GRAFT ACUTE MI REVASCULARIZATION N/A 02/26/2019   Procedure: Coronary/Graft Acute MI Revascularization;  Surgeon: Martinique, Peter M, MD;  Location: [place] [facility];  Service: Cardiovascular;  Laterality: N/A;  . LEFT HEART CATH AND CORONARY ANGIOGRAPHY N/A 02/26/2019   Procedure: LEFT HEART CATH AND CORONARY ANGIOGRAPHY;  Surgeon: Martinique, Peter M, MD;  Location: [place] [facility];  Service: Cardiovascular;  Laterality: N/A;     Current Meds  Medication Sig  . amLODipine (NORVASC) 10 MG tablet Take 10 mg by mouth daily.  Marland Kitchen aspirin 81 MG chewable tablet Chew 1 tablet (81 mg total) by mouth daily.  Marland Kitchen atorvastatin (LIPITOR) 80 MG tablet Take 1 tablet (80 mg total) by mouth daily at 6 PM.  . blood glucose meter kit and supplies Dispense based on patient and insurance preference. Use up to four times daily as directed.  (FOR ICD-10 E10.9, E11.9).  . clonazePAM (KLONOPIN) 0.5 MG tablet Take 0.5-1 mg by mouth 2 (two) times daily as needed for anxiety.   Marland Kitchen lisinopril (ZESTRIL) 30 MG tablet Take 1 tablet (30 mg total) by mouth daily.  . metFORMIN (GLUCOPHAGE) 500 MG tablet Take 1 tablet (500 mg total) by mouth 2 (two) times daily with a meal.  . metoprolol succinate (TOPROL-XL) 50 MG 24 hr tablet Take 1 tablet (50 mg total) by mouth daily. Take with or immediately following a meal.  . nitroGLYCERIN (NITROSTAT) 0.4 MG SL tablet Place 1 tablet (0.4 mg total) under the tongue every 5 (five) minutes x 3 doses as needed for chest pain.  . ticagrelor (BRILINTA) 90 MG TABS tablet Take 1 tablet (90 mg total) by mouth 2 (two) times daily.     Allergies:   Patient has no known allergies.   Social History   Tobacco Use  . Smoking status: Current Every Day Smoker    Packs/day: 1.00    Years: 20.00    Pack years: 20.00    Types: Cigarettes  . Smokeless tobacco: Never Used  Substance Use Topics  . Alcohol use: Yes    Alcohol/week: 0.0 standard drinks    Comment:  rare  . Drug use: No     Family Hx: The patient's family history includes Cancer in his mother and sister; Heart attack in his father; Heart disease in his father; Stroke in his father.  ROS:   Please see the history of present illness.     All other systems reviewed and are negative.   Labs/Other Tests and Data Reviewed:    Recent Labs: 02/26/2019: B Natriuretic Peptide 28.7; Hemoglobin 13.6; Platelets 127; TSH 2.862 04/04/2019: BUN 15; Creatinine, Ser 1.18; Potassium 4.5; Sodium 139   Recent Lipid Panel Lab Results  Component Value Date/Time   CHOL 247 (H) 02/25/2019 11:37 PM   TRIG 718 (H) 02/25/2019 11:37 PM   HDL 34 (L) 02/25/2019 11:37 PM   CHOLHDL 7.3 02/25/2019 11:37 PM   LDLCALC UNABLE TO CALCULATE IF TRIGLYCERIDE OVER 400 mg/dL 02/25/2019 11:37 PM   LDLDIRECT 146.7 (H) 02/25/2019 11:37 PM    Wt Readings from Last 3 Encounters:   04/11/19 227 lb (103 kg)  03/28/19 232 lb (105.2 kg)  03/20/19 234 lb 12.8 oz (106.5 kg)     Exam:    Vital Signs:  BP (!) 143/72   Pulse 63   Ht _0  (1.803 m)   Wt 227 lb (103 kg)   BMI 31.66 kg/m    Well nourished, well developed male in no  acute distress.   ASSESSMENT & PLAN:    1.  Essential hypertension-143/72 today.  Has blood pressure at home has continued to be systolic in the 270W. Increase lisinopril to 40 mg tablet at bedtime Continue amlodipine 10 mg tablet daily Continue metoprolol succinate63m tablet daily Increase physical activity as tolerated Heart healthy low-sodium diet-reviewed  keep blood pressure log Bring blood pressure cuff to next visit-patient states he is given a blood pressure cuff. Order BMP for 2 week-patient can have drawn at May pharmacy appointment  Hyperlipidemia-02/25/2019: Cholesterol 247; HDL 34; LDL Cholesterol UNABLE TO CALCULATE IF TRIGLYCERIDE OVER 400 mg/dL; Triglycerides 718; VLDL UNABLE TO CALCULATE IF TRIGLYCERIDE OVER 400 mg/dL Continue atorvastatin 80 mg tablet daily Heart healthy low-sodium diet-increase fiber Increase physical activity as tolerated Recheck lipid panel  Type 2 diabetes-mean plasma glucose 154.2,A1c 7.0 Continue metformin 5 mg tablet twice daily Heart healthy/carb modified Continue weight loss Followed by PCP  CAD-underwent cardiac catheterization for his STEMI on 02/26/2019. He received a DES x1 to his RCA. He also had proximal to mid LAD lesion 20%, second marginal lesion 40%, distal circumflex 40%,and 40% stenosed sidebranch in LPAV. Stillno chest pain . Continue Brilinta 90 mg tablet twice daily Continue aspirin 81 mg tablet daily Continue amlodipine 10 mg tablet daily Increaselisinopril to 40 mg tablet daily at bedtime Continue metoprolol succinate 50 mg tablet daily Continue nitroglycerin 0.4 mg sublingual tablet as needed Continue atorvastatin 80 mg tablet daily Heart healthy  low-sodium diet Increase physical activity as tolerated  Disposition: Follow-up with May pharmacist in 2 weeks for hypertension management and with Dr. JMartiniquein 2 months.  COVID-19 Education: The signs and symptoms of COVID-19 were discussed with the patient and how to seek care for testing (follow up with PCP or arrange E-visit).  The importance of social distancing was discussed today.  Patient Risk:   After full review of this patients clinical status, I feel that they are at least moderate risk at this time.  Time:   Today, I have spent 15 minutes with the patient with telehealth technology discussing hypertension.     Medication Adjustments/Labs and Tests  Ordered: Current medicines are reviewed at length with the patient today.  Concerns regarding medicines are outlined above.   Tests Ordered: BMP in 2 weeks  Medication Changes: Lisinopril 40 mg tablet at bedtime    Signed, Deberah Pelton, NP  04/11/2019 11:33 AM    [facility]

## 2019-04-11 ENCOUNTER — Telehealth: Payer: Self-pay | Admitting: Cardiology

## 2019-04-11 ENCOUNTER — Telehealth (INDEPENDENT_AMBULATORY_CARE_PROVIDER_SITE_OTHER): Payer: 59 | Admitting: General Practice

## 2019-04-11 ENCOUNTER — Encounter: Payer: Self-pay | Admitting: Adult Health

## 2019-04-11 VITALS — BP 143/72 | HR 63 | Ht 71.0 in | Wt 227.0 lb

## 2019-04-11 DIAGNOSIS — I2510 Atherosclerotic heart disease of native coronary artery without angina pectoris: Secondary | ICD-10-CM | POA: Diagnosis not present

## 2019-04-11 DIAGNOSIS — I10 Essential (primary) hypertension: Secondary | ICD-10-CM

## 2019-04-11 DIAGNOSIS — E7800 Pure hypercholesterolemia, unspecified: Secondary | ICD-10-CM

## 2019-04-11 DIAGNOSIS — E118 Type 2 diabetes mellitus with unspecified complications: Secondary | ICD-10-CM

## 2019-04-11 MED ORDER — LISINOPRIL 40 MG PO TABS: 40.0000 mg | ORAL_TABLET | Freq: Every day | ORAL | 3 refills | Status: DC

## 2019-04-11 MED FILL — LISINOPRIL 40 MG TABLET: 40 | 30 days supply | Qty: 30 | Fill #0

## 2019-04-11 NOTE — Patient Instructions (Addendum)
Medication Instructions:  INCREASE- Lisinopril 40 mg by mouth by daily  If you need a refill on your cardiac medications before your next appointment, please call your pharmacy.  Labwork: BMP in 2 weeks  HERE IN OUR OFFICE AT [facility]  You will NOT need to fast   If you have labs (blood work) drawn today and your tests are completely normal, you will receive your results only by: Marland Kitchen MyChart Message (if you have MyChart) OR . A paper copy in the mail If you have any lab test that is abnormal or we need to change your treatment, we will call you to review the results.  Testing/Procedures: None Ordered  Follow-Up: Your physician recommends that you schedule a follow-up appointment in: 2 weeks with Pharmacist for HTN  Your physician recommends that you schedule a follow-up appointment in: 2 Months with Dr Martinique    At [facility], you and your health needs are our priority.  As part of our continuing mission to provide you with exceptional heart care, we have created designated Provider Care Teams.  These Care Teams include your primary Cardiologist (physician) and Advanced Practice Providers (APPs -  Physician Assistants and Nurse Practitioners) who all work together to provide you with the care you need, when you need it.  Thank you for choosing [facility] at [facility]!!

## 2019-04-11 NOTE — Telephone Encounter (Signed)
LVM for patient to call and schedule a hypertension clinic appointment in 2 weeks and a 2 month appt with Dr. Martinique.

## 2019-04-26 ENCOUNTER — Ambulatory Visit (INDEPENDENT_AMBULATORY_CARE_PROVIDER_SITE_OTHER): Payer: 59 | Admitting: Pharmacist Clinician (PhC)/ Clinical Pharmacy Specialist

## 2019-04-26 ENCOUNTER — Other Ambulatory Visit: Payer: Self-pay

## 2019-04-26 VITALS — BP 120/70 | HR 58 | Resp 14 | Ht 72.0 in | Wt 239.0 lb

## 2019-04-26 DIAGNOSIS — I10 Essential (primary) hypertension: Secondary | ICD-10-CM | POA: Diagnosis not present

## 2019-04-26 DIAGNOSIS — E7800 Pure hypercholesterolemia, unspecified: Secondary | ICD-10-CM | POA: Diagnosis not present

## 2019-04-26 DIAGNOSIS — E785 Hyperlipidemia, unspecified: Secondary | ICD-10-CM | POA: Diagnosis not present

## 2019-04-26 DIAGNOSIS — I2510 Atherosclerotic heart disease of native coronary artery without angina pectoris: Secondary | ICD-10-CM | POA: Diagnosis not present

## 2019-04-26 NOTE — Assessment & Plan Note (Signed)
Patient with essential hypertension, currently with good office readings.  Patient concerned about elevated home readings, but did not bring home cuff or readings with him.  There is potential concern for masked hypertension, however we will look at home BP technique and verify home meter accuracy before we decide that.  Reviewed in detail with patient best technique for checking home BP and gave written instructions with AVS.  Also stressed the need for him to bring home cuff to next appointment.  No changes to medications today, as BP looks good.  Will repeat BMET to see if improvement in SCr after the previous increase.

## 2019-04-26 NOTE — Progress Notes (Signed)
   04/26/2019 Timothy May 09/05/1960 [id number]   HPI:  Timothy May is a 58 y.o. male patient of Dr Jordan, with a PMH below who presents today for hypertension clinic evaluation.  See below for other pertinent medical history.  Patient has had 3 visits with Timothy Cleaver NP over the past month, and been concerned about continued elevated home BP readings.  In office his readings have been near normal (138/72, 136/70), however he notes home readings more in the 140's.    Most recently his lisinopril was increased from 20 to 40 mg daily.  A BMET done 8-9 days after the dose increase showed an increase in SCr from 0.89 to 1.18 (25% increase).    Past Medical History: DM2 (9/20) A1c 7.0 - metformin 500 mg gid  hyperlipidemia (9/20) TC 247, TG 718, HDL 34, LDL (direct) 146.7 - atorvastatin 80 mg  CAD DES to RCA  Anxiety/depression Has prn clonazepam  Tobacco abuse Quit smoking last week     Blood Pressure Goal:  130/80  Current Medications: amlodipine 10 mg, lisinopril 40 mg(pm), metoprolol succ 50 mg qd  Family Hx: father died at 67, stroke; mother 82, no cardiac issues; 2 sisters healthy, 1 son born with heart murmur, outgrew  Social Hx: quit tobacco last week, Chantix/nicorette gum; no alcohol; coffee in the morning, rare sodas  Diet: home cooked meals; lots of fruits and vegetables,  less breads, pastas;  Dinner usually meat, salad and veggie (fresh or frozen)  Exercise: walks most days (5/7)  Home BP readings: new Omron machine, no readings or cuff with him today.  States mostly high 130's and 140's systolic, with diastolic mostly < 80.    Intolerances: nkda  Labs: 03/2019:  Na 139, K 4.5, Glu 108, BUN 15, SCr 1.18, GFR 69, CrCl 105.3 (59.5 with IBW)  Wt Readings from Last 3 Encounters:  04/26/19 239 lb (108.4 kg)  04/11/19 227 lb (103 kg)  03/28/19 232 lb (105.2 kg)   BP Readings from Last 3 Encounters:  04/26/19 120/70  04/11/19 (!) 143/72  03/28/19 (!)  144/74   Pulse Readings from Last 3 Encounters:  04/26/19 (!) 58  04/11/19 63  03/28/19 63    Current Outpatient Medications  Medication Sig Dispense Refill  . amLODipine (NORVASC) 10 MG tablet Take 10 mg by mouth daily.    . aspirin 81 MG chewable tablet Chew 1 tablet (81 mg total) by mouth daily. 90 tablet 1  . atorvastatin (LIPITOR) 80 MG tablet Take 1 tablet (80 mg total) by mouth daily at 6 PM. 90 tablet 1  . blood glucose meter kit and supplies Dispense based on patient and insurance preference. Use up to four times daily as directed. (FOR ICD-10 E10.9, E11.9). 1 each 0  . clonazePAM (KLONOPIN) 0.5 MG tablet Take 0.5-1 mg by mouth 2 (two) times daily as needed for anxiety.     . lisinopril (ZESTRIL) 40 MG tablet Take 1 tablet (40 mg total) by mouth daily. 90 tablet 3  . metFORMIN (GLUCOPHAGE) 500 MG tablet Take 1 tablet (500 mg total) by mouth 2 (two) times daily with a meal. 60 tablet 1  . metoprolol succinate (TOPROL-XL) 50 MG 24 hr tablet Take 1 tablet (50 mg total) by mouth daily. Take with or immediately following a meal. 90 tablet 1  . nitroGLYCERIN (NITROSTAT) 0.4 MG SL tablet Place 1 tablet (0.4 mg total) under the tongue every 5 (five) minutes x 3 doses as needed for   chest pain. 25 tablet 2  . ticagrelor (BRILINTA) 90 MG TABS tablet Take 1 tablet (90 mg total) by mouth 2 (two) times daily. 180 tablet 2   No current facility-administered medications for this visit.     No Known Allergies  Past Medical History:  Diagnosis Date  . DDD (degenerative disc disease), lumbar    followed by Dr Nudelman  . Diabetes (HCC)   . Gout   . Hypercholesteremia   . Hyperlipidemia   . Hypertension   . Renal calculus or stone   . STEMI (ST elevation myocardial infarction) (HCC)    02/26/19 PCI/DES to the RCA  . Tobacco abuse   . Type 2 diabetes mellitus with complication, without long-term current use of insulin (HCC) 02/27/2019    Blood pressure 120/70, pulse (!) 58, resp. rate 14,  height 6' (1.829 m), weight 239 lb (108.4 kg), SpO2 97 %.  Essential hypertension Patient with essential hypertension, currently with good office readings.  Patient concerned about elevated home readings, but did not bring home cuff or readings with him.  There is potential concern for masked hypertension, however we will look at home BP technique and verify home meter accuracy before we decide that.  Reviewed in detail with patient best technique for checking home BP and gave written instructions with AVS.  Also stressed the need for him to bring home cuff to next appointment.  No changes to medications today, as BP looks good.  Will repeat BMET to see if improvement in SCr after the previous increase.    Hyperlipidemia Patient with elevated LDL and triglycerides.  A review in [facility] shows that his triglycerides were previously better controlled at 195 (8/19) and 219 (01/2016).  Will repeat labs today and determine best course of action once those are resulted.  Reviewed food items that can increase triglycerides.     Kristin Alvstad PharmD CPP [facility] [address] [place], [place] [address] [phone] 

## 2019-04-26 NOTE — Assessment & Plan Note (Signed)
Patient with elevated LDL and triglycerides.  A review in [place] shows that his triglycerides were previously better controlled at 195 (8/19) and 219 (01/2016).  Will repeat labs today and determine best course of action once those are resulted.  Reviewed food items that can increase triglycerides.

## 2019-04-26 NOTE — Patient Instructions (Signed)
Return for a a follow up appointment December 15  Go to the lab today for cholesterol and kidney function  Check your blood pressure at home daily (if able) and keep record of the readings.  Take your BP meds as follows:  Continue with all current medications  Bring all of your meds, your BP cuff and your record of home blood pressures to your next appointment.  Exercise as you're able, try to walk approximately 30 minutes per day.  Keep salt intake to a minimum, especially watch canned and prepared boxed foods.  Eat more fresh fruits and vegetables and fewer canned items.  Avoid eating in fast food restaurants.    HOW TO TAKE YOUR BLOOD PRESSURE: . Rest 5 minutes before taking your blood pressure. .  Don't smoke or drink caffeinated beverages for at least 30 minutes before. . Take your blood pressure before (not after) you eat. . Sit comfortably with your back supported and both feet on the floor (don't cross your legs). . Elevate your arm to heart level on a table or a desk. . Use the proper sized cuff. It should fit smoothly and snugly around your bare upper arm. There should be enough room to slip a fingertip under the cuff. The bottom edge of the cuff should be 1 inch above the crease of the elbow. . Ideally, take 3 measurements at one sitting and record the average.

## 2019-04-27 LAB — LDL CHOLESTEROL, DIRECT: LDL Direct: 94 mg/dL (ref 0–99)

## 2019-04-27 LAB — BASIC METABOLIC PANEL
BUN/Creatinine Ratio: 14 (ref 9–20)
BUN: 11 mg/dL (ref 6–24)
CO2: 22 mmol/L (ref 20–29)
Calcium: 10.1 mg/dL (ref 8.7–10.2)
Chloride: 102 mmol/L (ref 96–106)
Creatinine, Ser: 0.77 mg/dL (ref 0.76–1.27)
GFR calc Af Amer: 117 mL/min/{1.73_m2} (ref >59)
GFR calc non Af Amer: 101 mL/min/{1.73_m2} (ref >59)
Glucose: 110 mg/dL — ABNORMAL HIGH (ref 65–99)
Potassium: 4.9 mmol/L (ref 3.5–5.2)
Sodium: 138 mmol/L (ref 134–144)

## 2019-04-27 MED FILL — metFORMIN HCL 500 MG TABS: 500 | 30 days supply | Qty: 60 | Fill #0

## 2019-04-27 MED FILL — BRILINTA 90 MG TABLET: 90 | 30 days supply | Qty: 60 | Fill #1

## 2019-04-30 ENCOUNTER — Other Ambulatory Visit: Payer: Self-pay | Admitting: Pharmacist Clinician (PhC)/ Clinical Pharmacy Specialist

## 2019-04-30 MED ORDER — EZETIMIBE 10 MG PO TABS: 10.0000 mg | ORAL_TABLET | Freq: Every day | ORAL | 3 refills | Status: DC

## 2019-04-30 MED FILL — EZETIMIBE 10 MG TABS: 10 | 90 days supply | Qty: 90 | Fill #0

## 2019-05-02 LAB — LIPID PANEL
Chol/HDL Ratio: 3.8 ratio (ref 0.0–5.0)
Cholesterol, Total: 155 mg/dL (ref 100–199)
HDL: 41 mg/dL (ref >39)
LDL Chol Calc (NIH): 90 mg/dL (ref 0–99)
Triglycerides: 135 mg/dL (ref 0–149)
VLDL Cholesterol Cal: 24 mg/dL (ref 5–40)

## 2019-05-02 LAB — SPECIMEN STATUS REPORT

## 2019-05-16 MED FILL — LISINOPRIL 40 MG TABLET: 40 | 90 days supply | Qty: 90 | Fill #0

## 2019-05-16 MED FILL — ASPIRIN CHILD 81 MG TAB CHE: 81 | 90 days supply | Qty: 90 | Fill #0

## 2019-05-16 MED FILL — AMLODIPINE BESYLATE 10 MG T: 10 | 90 days supply | Qty: 90 | Fill #0

## 2019-05-16 MED FILL — ATORVASTATIN 80 MG TABLET: 80 | 90 days supply | Qty: 90 | Fill #0

## 2019-05-16 MED FILL — METOPROLOL SUCCINATE ER 50: 50 | 90 days supply | Qty: 90 | Fill #0

## 2019-05-21 MED FILL — metFORMIN HCL 500 MG TABS: 500 | 30 days supply | Qty: 60 | Fill #1

## 2019-05-21 MED FILL — BRILINTA 90 MG TABLET: 90 | 30 days supply | Qty: 60 | Fill #2

## 2019-05-22 DIAGNOSIS — H2511 Age-related nuclear cataract, right eye: Secondary | ICD-10-CM | POA: Diagnosis not present

## 2019-05-22 DIAGNOSIS — H43822 Vitreomacular adhesion, left eye: Secondary | ICD-10-CM | POA: Diagnosis not present

## 2019-05-22 DIAGNOSIS — H43821 Vitreomacular adhesion, right eye: Secondary | ICD-10-CM | POA: Diagnosis not present

## 2019-05-22 DIAGNOSIS — E119 Type 2 diabetes mellitus without complications: Secondary | ICD-10-CM | POA: Diagnosis not present

## 2019-05-22 DIAGNOSIS — H2512 Age-related nuclear cataract, left eye: Secondary | ICD-10-CM | POA: Diagnosis not present

## 2019-06-05 ENCOUNTER — Ambulatory Visit (INDEPENDENT_AMBULATORY_CARE_PROVIDER_SITE_OTHER): Payer: 59 | Admitting: Pharmacist Clinician (PhC)/ Clinical Pharmacy Specialist

## 2019-06-05 ENCOUNTER — Other Ambulatory Visit: Payer: Self-pay

## 2019-06-05 DIAGNOSIS — I10 Essential (primary) hypertension: Secondary | ICD-10-CM

## 2019-06-05 DIAGNOSIS — E7800 Pure hypercholesterolemia, unspecified: Secondary | ICD-10-CM

## 2019-06-05 NOTE — Progress Notes (Signed)
06/08/2019 [name] 1961/04/15 [id number]   HPI:  Timothy May is a 58 y.o. male patient of Timothy May, with a [place] below who presents today for hypertension and hyperlipidemia clinic follow up.  See below for other pertinent medical history.  Patient has had 3 visits with Timothy May over the past month, and been concerned about continued elevated home BP readings.  In office his readings have been near normal (138/72, 136/70), however he notes home readings more in the 140's.    After increasing his lisinopril  from 20 to 40 mg daily a BMET showed an increase in SCr from 0.89 to 1.18 (25% increase).  Two weeks later it was back down to 0.77.  He has been doing well with current regimen and has no complaints today.  After his September lipid panel he was started on atorvastatin 80 mg daly  Past Medical History: DM2 (9/20) A1c 7.0 - metformin 500 mg gid  hyperlipidemia (9/20) TC 247, TG 718, HDL 34, LDL (direct) 146.7 - atorvastatin 80 mg (11/20)  CAD DES to RCA  Anxiety/depression Has prn clonazepam  Tobacco abuse Quit smoking fall 2020     Blood Pressure Goal:  130/80  Current Medications: amlodipine 10 mg, lisinopril 40 mg(pm), metoprolol succ 50 mg qd  Family Hx: father died at 43, stroke; mother 19, no cardiac issues; 2 sisters healthy, 1 son born with heart murmur, outgrew  Social Hx: quit tobacco last week, Chantix/nicorette gum; no alcohol; coffee in the morning, rare diet sodas  Diet: home cooked meals; lots of fruits and vegetables,  less breads, pastas;  Dinner usually meat, salad and veggie (fresh or frozen)  Exercise: walks most days (5/7)  Home BP readings: new Omron machine, no readings or cuff with him today.  State readings mostly WNL that he can recall.    Labs: 03/2019:  Na 139, K 4.5, Glu 108, BUN 15, SCr 1.18, GFR 69, CrCl 105.3 (59.5 with IBW)  05/2020:  Na 138, K 4.9, Glu 110, BUN 11, SCr 0.77 GFR 101, CrCl 121 (w/IBW)    02/2019:  TC  247, TG 718, HDL 34, LDL (direct) 146  04/2019:  TC 155, TG 135, HDL 41, LDL 90 (atorvastatin 80 mg)  Wt Readings from Last 3 Encounters:  04/26/19 239 lb (108.4 kg)  04/11/19 227 lb (103 kg)  03/28/19 232 lb (105.2 kg)   BP Readings from Last 3 Encounters:  06/05/19 (!) 126/6  04/26/19 120/70  04/11/19 (!) 143/72   Pulse Readings from Last 3 Encounters:  04/26/19 (!) 58  04/11/19 63  03/28/19 63    Current Outpatient Medications  Medication Sig Dispense Refill  . amLODipine (NORVASC) 10 MG tablet Take 10 mg by mouth daily.    Marland Kitchen aspirin 81 MG chewable tablet Chew 1 tablet (81 mg total) by mouth daily. 90 tablet 1  . atorvastatin (LIPITOR) 80 MG tablet Take 1 tablet (80 mg total) by mouth daily at 6 PM. 90 tablet 1  . blood glucose meter kit and supplies Dispense based on patient and insurance preference. Use up to four times daily as directed. (FOR ICD-10 E10.9, E11.9). 1 each 0  . clonazePAM (KLONOPIN) 0.5 MG tablet Take 0.5-1 mg by mouth 2 (two) times daily as needed for anxiety.     Marland Kitchen ezetimibe (ZETIA) 10 MG tablet Take 1 tablet (10 mg total) by mouth daily. 90 tablet 3  . lisinopril (ZESTRIL) 40 MG tablet Take 1 tablet (  40 mg total) by mouth daily. 90 tablet 3  . metFORMIN (GLUCOPHAGE) 500 MG tablet Take 1 tablet (500 mg total) by mouth 2 (two) times daily with a meal. 60 tablet 1  . metoprolol succinate (TOPROL-XL) 50 MG 24 hr tablet Take 1 tablet (50 mg total) by mouth daily. Take with or immediately following a meal. 90 tablet 1  . nitroGLYCERIN (NITROSTAT) 0.4 MG SL tablet Place 1 tablet (0.4 mg total) under the tongue every 5 (five) minutes x 3 doses as needed for chest pain. 25 tablet 2  . ticagrelor (BRILINTA) 90 MG TABS tablet Take 1 tablet (90 mg total) by mouth 2 (two) times daily. 180 tablet 2   No current facility-administered medications for this visit.    No Known Allergies  Past Medical History:  Diagnosis Date  . DDD (degenerative disc disease), lumbar     followed by Timothy Sherwood Gambler  . Diabetes ([facility])   . Gout   . Hypercholesteremia   . Hyperlipidemia   . Hypertension   . Renal calculus or stone   . STEMI (ST elevation myocardial infarction) ([facility])    02/26/19 PCI/DES to the RCA  . Tobacco abuse   . Type 2 diabetes mellitus with complication, without long-term current use of insulin ([facility]) 02/27/2019    Blood pressure (!) 126/6.  Essential hypertension Pt with hypertension, now well controlled.  He will continue with his current regimen.  He is aware to continue with this regimen and continue with regular home BP checks  He will follow up with Timothy. Martinique in January and we can see him again if needed.    Hyperlipidemia Lipid panel now much improved since started atorvastatin 80 mg daily.  No concerns about medication at this time.  He will continue this for now and repeat labs again in 3 months.    Timothy May PharmD CPP [facility] [address] [place], [place] [address] [phone]

## 2019-06-05 NOTE — Patient Instructions (Signed)
Return for a a follow up appointment with Dr. Martinique in January  Your blood pressure today is 126/62  Check your blood pressure at home several times each week and keep record of the readings.  Have you wife check your pulse point while the machine is inflating/deflating.  When the pulse disappears/appears this is about where your systolic BP should be  Take your BP meds as follows:  Continue with all current medications.   Bring all of your meds, your BP cuff and your record of home blood pressures to your next appointment.  Exercise as you're able, try to walk approximately 30 minutes per day.  Keep salt intake to a minimum, especially watch canned and prepared boxed foods.  Eat more fresh fruits and vegetables and fewer canned items.  Avoid eating in fast food restaurants.    HOW TO TAKE YOUR BLOOD PRESSURE: . Rest 5 minutes before taking your blood pressure. .  Don't smoke or drink caffeinated beverages for at least 30 minutes before. . Take your blood pressure before (not after) you eat. . Sit comfortably with your back supported and both feet on the floor (don't cross your legs). . Elevate your arm to heart level on a table or a desk. . Use the proper sized cuff. It should fit smoothly and snugly around your bare upper arm. There should be enough room to slip a fingertip under the cuff. The bottom edge of the cuff should be 1 inch above the crease of the elbow. . Ideally, take 3 measurements at one sitting and record the average.

## 2019-06-06 DIAGNOSIS — Z79899 Other long term (current) drug therapy: Secondary | ICD-10-CM | POA: Diagnosis not present

## 2019-06-06 DIAGNOSIS — Z125 Encounter for screening for malignant neoplasm of prostate: Secondary | ICD-10-CM | POA: Diagnosis not present

## 2019-06-06 DIAGNOSIS — I10 Essential (primary) hypertension: Secondary | ICD-10-CM | POA: Diagnosis not present

## 2019-06-06 DIAGNOSIS — E785 Hyperlipidemia, unspecified: Secondary | ICD-10-CM | POA: Diagnosis not present

## 2019-06-06 DIAGNOSIS — F419 Anxiety disorder, unspecified: Secondary | ICD-10-CM | POA: Diagnosis not present

## 2019-06-06 DIAGNOSIS — E1159 Type 2 diabetes mellitus with other circulatory complications: Secondary | ICD-10-CM | POA: Diagnosis not present

## 2019-06-06 DIAGNOSIS — Z7984 Long term (current) use of oral hypoglycemic drugs: Secondary | ICD-10-CM | POA: Diagnosis not present

## 2019-06-06 MED FILL — clonazePAM 1 MG TABS: 1 | 30 days supply | Qty: 60 | Fill #0

## 2019-06-08 ENCOUNTER — Encounter: Payer: Self-pay | Admitting: Pharmacist Clinician (PhC)/ Clinical Pharmacy Specialist

## 2019-06-08 NOTE — Assessment & Plan Note (Signed)
Lipid panel now much improved since started atorvastatin 80 mg daily.  No concerns about medication at this time.  He will continue this for now and repeat labs again in 3 months.

## 2019-06-08 NOTE — Assessment & Plan Note (Signed)
Pt with hypertension, now well controlled.  He will continue with his current regimen.  He is aware to continue with this regimen and continue with regular home BP checks  He will follow up with Dr. Martinique in January and we can see him again if needed.

## 2019-06-21 NOTE — Progress Notes (Signed)
Cardiology Office Note   Date:  06/26/2019   ID:  Timothy May, DOB 1961-05-27, MRN [id number]  PCP:  Timothy Melter, MD  Cardiologist:  Timothy Ferrucci Martinique, MD   Chief Complaint  Patient presents with  . Coronary Artery Disease      History of Present Illness: Timothy May is a 58 y.o. male who is seen for follow up CAD. He presented with acute inferior STEMI on 02/26/19.  He received a DES x1 to his RCA.  He had proximal to mid LAD lesion that was 20%, second marginal lesion 40%, distal circumflex 40% and 40% stenosed sidebranch in LPA V.  His PMH also includes essential hypertension, type 2 diabetes, generalized anxiety disorder, hyperlipidemia, tobacco abuse, dizziness, and depression.   On follow up today he is doing well. He has not smoked since September. He has gained a little weight. He denies any chest pain or dyspnea but does feel tired a lot. He is doing a lot of walking and works in [employer] so is physically active. He is concerned that recent lab work showed he is spilling protein in his urine. BS at home typically 110-160.     Past Medical History:  Diagnosis Date  . DDD (degenerative disc disease), lumbar    followed by Dr Sherwood Gambler  . Diabetes ([place])   . Gout   . Hypercholesteremia   . Hyperlipidemia   . Hypertension   . Renal calculus or stone   . STEMI (ST elevation myocardial infarction) (Timothy May)    02/26/19 PCI/DES to the RCA  . Tobacco abuse   . Type 2 diabetes mellitus with complication, without long-term current use of insulin ([facility]) 02/27/2019    Past Surgical History:  Procedure Laterality Date  . APPENDECTOMY     20 years ago  . CORONARY STENT INTERVENTION N/A 02/26/2019   Procedure: CORONARY STENT INTERVENTION;  Surgeon: May, Calloway Andrus M, MD;  Location: [facility];  Service: Cardiovascular;  Laterality: N/A;  . CORONARY/GRAFT ACUTE MI REVASCULARIZATION N/A 02/26/2019   Procedure: Coronary/Graft Acute MI Revascularization;  Surgeon: May,  Jai Bear M, MD;  Location: [facility];  Service: Cardiovascular;  Laterality: N/A;  . LEFT HEART CATH AND CORONARY ANGIOGRAPHY N/A 02/26/2019   Procedure: LEFT HEART CATH AND CORONARY ANGIOGRAPHY;  Surgeon: May, Ayanni Tun M, MD;  Location: [facility];  Service: Cardiovascular;  Laterality: N/A;     Current Outpatient Medications  Medication Sig Dispense Refill  . amLODipine (NORVASC) 10 MG tablet Take 10 mg by mouth daily.    Marland Kitchen aspirin 81 MG chewable tablet Chew 1 tablet (81 mg total) by mouth daily. 90 tablet 1  . atorvastatin (LIPITOR) 80 MG tablet Take 1 tablet (80 mg total) by mouth daily at 6 PM. 90 tablet 1  . blood glucose meter kit and supplies Dispense based on patient and insurance preference. Use up to four times daily as directed. (FOR ICD-10 E10.9, E11.9). 1 each 0  . clonazePAM (KLONOPIN) 0.5 MG tablet Take 0.5-1 mg by mouth 2 (two) times daily as needed for anxiety.     Marland Kitchen ezetimibe (ZETIA) 10 MG tablet Take 1 tablet (10 mg total) by mouth daily. 90 tablet 3  . lisinopril (ZESTRIL) 40 MG tablet Take 1 tablet (40 mg total) by mouth daily. 90 tablet 3  . metFORMIN (GLUCOPHAGE) 500 MG tablet Take 1 tablet (500 mg total) by mouth 2 (two) times daily with a meal. 60 tablet 1  . metoprolol succinate (  TOPROL-XL) 25 MG 24 hr tablet Take 1 tablet (25 mg total) by mouth daily. Take with or immediately following a meal. 90 tablet 3  . nitroGLYCERIN (NITROSTAT) 0.4 MG SL tablet Place 1 tablet (0.4 mg total) under the tongue every 5 (five) minutes x 3 doses as needed for chest pain. 25 tablet 2  . ticagrelor (BRILINTA) 90 MG TABS tablet Take 1 tablet (90 mg total) by mouth 2 (two) times daily. 180 tablet 2   No current facility-administered medications for this visit.    Allergies:   Patient has no known allergies.    Social History:  The patient  reports that he quit smoking about 2 months ago. His smoking use included cigarettes. He has a 20.00 pack-year smoking history. He has  never used smokeless tobacco. He reports current alcohol use. He reports that he does not use drugs.   Family History:  The patient's family history includes Cancer in his mother and sister; Heart attack in his father; Heart disease in his father; Stroke in his father.    ROS:  Please see the history of present illness.   Otherwise, review of systems are positive for none.   All other systems are reviewed and negative.    PHYSICAL EXAM: VS:  BP 129/72   Pulse 97   Temp 97.7 F (36.5 C)   Ht '6\' 1"'$  (1.854 m)   Wt 243 lb (110.2 kg)   SpO2 99%   BMI 32.06 kg/m  , BMI Body mass index is 32.06 kg/m. GEN: Well nourished, overweight, in no acute distress  HEENT: normal  Neck: no JVD, carotid bruits, or masses Cardiac: RRR; no murmurs, rubs, or gallops,no edema  Respiratory:  clear to auscultation bilaterally, normal work of breathing GI: soft, nontender, nondistended, + BS MS: no deformity or atrophy  Skin: warm and dry, no rash Neuro:  Strength and sensation are intact Psych: euthymic mood, full affect   EKG:  EKG is not ordered today. The ekg ordered today demonstrates N/A   Recent Labs: 02/26/2019: B Natriuretic Peptide 28.7; Hemoglobin 13.6; Platelets 127; TSH 2.862 04/26/2019: BUN 11; Creatinine, Ser 0.77; Potassium 4.9; Sodium 138    Lipid Panel    Component Value Date/Time   CHOL 155 04/26/2019 1038   TRIG 135 04/26/2019 1038   HDL 41 04/26/2019 1038   CHOLHDL 3.8 04/26/2019 1038   CHOLHDL 7.3 02/25/2019 2337   VLDL UNABLE TO CALCULATE IF TRIGLYCERIDE OVER 400 mg/dL 02/25/2019 2337   LDLCALC 90 04/26/2019 1038   LDLDIRECT 94 04/26/2019 1038   LDLDIRECT 146.7 (H) 02/25/2019 2337    Dated 04/26/19: cholesterol 155, triglycerides 135, HDL 41, LDL 94.  Dated 06/06/19: A1c 6.9%. LFTs normal.,   Wt Readings from Last 3 Encounters:  06/26/19 243 lb (110.2 kg)  04/26/19 239 lb (108.4 kg)  04/11/19 227 lb (103 kg)      Other studies Reviewed: Additional studies/  records that were reviewed today include:  Echocardiogram 02/26/2019 IMPRESSIONS  1. The left ventricle has normal systolic function, with an ejection fraction of 55-60%. The cavity size was normal. There is moderately increased left ventricular wall thickness. Left ventricular diastolic Doppler parameters are consistent with  pseudonormalization. 2. There is mild mitral annular calcification present. 3. The aortic valve is tricuspid. Mild sclerosis of the aortic valve. 4. The aorta is normal unless otherwise noted. 5. The inferior vena cava was dilated in size with >50% respiratory variability  Cardiac cath 02/26/19: Procedures  Coronary/Graft Acute MI Revascularization  CORONARY STENT INTERVENTION  LEFT HEART CATH AND CORONARY ANGIOGRAPHY  Conclusion    Prox LAD to Mid LAD lesion is 20% stenosed.  2nd Mrg lesion is 40% stenosed.  Dist Cx lesion is 40% stenosed with 40% stenosed side branch in LPAV.  Prox RCA lesion is 100% stenosed.  Post intervention, there is a 0% residual stenosis.  A drug-eluting stent was successfully placed using a STENT SYNERGY DES 2.5X16.  The left ventricular systolic function is normal.  LV end diastolic pressure is normal.  The left ventricular ejection fraction is 55-65% by visual estimate.   1. 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 Normal LV function 3. Normal LVEDP 4. Successful PCI of the RCA with DES x 1  Plan: DAPT for one year. Patient is a candidate for fast track DC.        ASSESSMENT AND PLAN:  1. CAD s/p inferior STEMI on 02/26/19. DES of RCA x 1. No other obstructive disease. Normal LV function. Continue DAPT for one year. With significant fatigue will reduce Toprol XL to 25 mg daily.   2. HTN- well controlled.   3. Hyperlipidemia- on high dose statin and Zetia. LDL still not at goal of < 70. Will have him see our Pharm D to consider addition of Repatha.   4. DM type 2. On metformin. Managed by  primary care. If additional therapy is warranted would favor a GLP1 or SGLT2 inhibitor for CV benefits.   Current medicines are reviewed at length with the patient today.  The patient does not have concerns regarding medicines.  The following changes have been made:  See above  Labs/ tests ordered today include:  No orders of the defined types were placed in this encounter.    Disposition:   FU with me in 6 months  Signed, Jerriann Schrom Martinique, MD  06/26/2019 8:53 AM    [facility] [address], [place], [place], [address] Phone [phone], Fax [phone]

## 2019-06-26 ENCOUNTER — Other Ambulatory Visit: Payer: Self-pay

## 2019-06-26 ENCOUNTER — Ambulatory Visit (INDEPENDENT_AMBULATORY_CARE_PROVIDER_SITE_OTHER): Payer: 59 | Admitting: Cardiology

## 2019-06-26 ENCOUNTER — Encounter: Payer: Self-pay | Admitting: Cardiology

## 2019-06-26 VITALS — BP 129/72 | HR 97 | Temp 97.7°F | Ht 73.0 in | Wt 243.0 lb

## 2019-06-26 DIAGNOSIS — E7800 Pure hypercholesterolemia, unspecified: Secondary | ICD-10-CM | POA: Diagnosis not present

## 2019-06-26 DIAGNOSIS — E118 Type 2 diabetes mellitus with unspecified complications: Secondary | ICD-10-CM

## 2019-06-26 DIAGNOSIS — I2510 Atherosclerotic heart disease of native coronary artery without angina pectoris: Secondary | ICD-10-CM

## 2019-06-26 DIAGNOSIS — Z720 Tobacco use: Secondary | ICD-10-CM | POA: Diagnosis not present

## 2019-06-26 DIAGNOSIS — I10 Essential (primary) hypertension: Secondary | ICD-10-CM | POA: Diagnosis not present

## 2019-06-26 MED ORDER — METOPROLOL SUCCINATE ER 25 MG PO TB24: 25.0000 mg | ORAL_TABLET | Freq: Every day | ORAL | 3 refills | Status: DC

## 2019-06-26 MED FILL — BRILINTA 90 MG TABLET: 90 | 30 days supply | Qty: 60 | Fill #3

## 2019-06-26 MED FILL — metFORMIN HCL 500 MG TABS: 500 | 90 days supply | Qty: 180 | Fill #0

## 2019-06-26 MED FILL — METOPROLOL SUCCINATE ER 25: 25 | 90 days supply | Qty: 90 | Fill #0

## 2019-06-26 NOTE — Patient Instructions (Signed)
Reduce Toprol XL to 25 mg daily  We will have you see our pharmacist to explore options for lowering your cholesterol  Congratulations on quitting smoking

## 2019-07-03 DIAGNOSIS — Z20822 Contact with and (suspected) exposure to covid-19: Secondary | ICD-10-CM | POA: Diagnosis not present

## 2019-07-26 ENCOUNTER — Ambulatory Visit: Payer: 59

## 2019-07-26 ENCOUNTER — Telehealth: Payer: Self-pay | Admitting: Pharmacist

## 2019-07-26 DIAGNOSIS — E7800 Pure hypercholesterolemia, unspecified: Secondary | ICD-10-CM

## 2019-07-26 NOTE — Telephone Encounter (Signed)
No lipid panel completed since starting combination with lipitor ans zetia.   Will repeat fasting blood work tomorrow 07/27/2019, then make decision if [id number] still needed.

## 2019-07-27 DIAGNOSIS — I2510 Atherosclerotic heart disease of native coronary artery without angina pectoris: Secondary | ICD-10-CM | POA: Diagnosis not present

## 2019-07-27 DIAGNOSIS — Z7689 Persons encountering health services in other specified circumstances: Secondary | ICD-10-CM | POA: Diagnosis not present

## 2019-07-28 LAB — COMPREHENSIVE METABOLIC PANEL
ALT: 48 IU/L — ABNORMAL HIGH (ref 0–44)
AST: 24 IU/L (ref 0–40)
Albumin/Globulin Ratio: 2.2 (ref 1.2–2.2)
Albumin: 5.1 g/dL — ABNORMAL HIGH (ref 3.8–4.9)
Alkaline Phosphatase: 93 IU/L (ref 39–117)
BUN/Creatinine Ratio: 18 (ref 9–20)
BUN: 15 mg/dL (ref 6–24)
Bilirubin Total: 0.3 mg/dL (ref 0.0–1.2)
CO2: 23 mmol/L (ref 20–29)
Calcium: 10.2 mg/dL (ref 8.7–10.2)
Chloride: 99 mmol/L (ref 96–106)
Creatinine, Ser: 0.84 mg/dL (ref 0.76–1.27)
GFR calc Af Amer: 113 mL/min/{1.73_m2} (ref >59)
GFR calc non Af Amer: 98 mL/min/{1.73_m2} (ref >59)
Globulin, Total: 2.3 g/dL (ref 1.5–4.5)
Glucose: 142 mg/dL — ABNORMAL HIGH (ref 65–99)
Potassium: 4.6 mmol/L (ref 3.5–5.2)
Sodium: 135 mmol/L (ref 134–144)
Total Protein: 7.4 g/dL (ref 6.0–8.5)

## 2019-07-28 LAB — LIPID PANEL WITH LDL/HDL RATIO
Cholesterol, Total: 118 mg/dL (ref 100–199)
HDL: 36 mg/dL — ABNORMAL LOW (ref >39)
LDL Chol Calc (NIH): 59 mg/dL (ref 0–99)
LDL/HDL Ratio: 1.6 ratio (ref 0.0–3.6)
Triglycerides: 131 mg/dL (ref 0–149)
VLDL Cholesterol Cal: 23 mg/dL (ref 5–40)

## 2019-07-28 LAB — LDL CHOLESTEROL, DIRECT: LDL Direct: 61 mg/dL (ref 0–99)

## 2019-07-30 NOTE — Telephone Encounter (Signed)
LDL at goal with Atorvastatin and Zetia.  No need for medication changes per Dr [place]

## 2019-08-03 ENCOUNTER — Other Ambulatory Visit (HOSPITAL_COMMUNITY): Payer: Self-pay | Admitting: Family Medicine

## 2019-08-03 ENCOUNTER — Other Ambulatory Visit: Payer: Self-pay | Admitting: Cardiology

## 2019-08-03 MED FILL — BRILINTA 90 MG TABLET: 90 | 30 days supply | Qty: 60 | Fill #4

## 2019-08-03 MED FILL — EZETIMIBE 10 MG TABS: 10 | 90 days supply | Qty: 90 | Fill #1

## 2019-08-03 MED FILL — TADALAFIL 20 MG TABS: 20 | 30 days supply | Qty: 18 | Fill #0

## 2019-08-06 MED FILL — ASPIRIN CHILD 81 MG TAB CHE: 81 | 90 days supply | Qty: 90 | Fill #0

## 2019-08-06 MED FILL — ATORVASTATIN 80 MG TABLET: 80 | 90 days supply | Qty: 90 | Fill #0

## 2019-08-28 ENCOUNTER — Other Ambulatory Visit: Payer: Self-pay | Admitting: Cardiology

## 2019-08-28 MED FILL — BRILINTA 90 MG TABLET: 90 | 30 days supply | Qty: 60 | Fill #5

## 2019-08-28 MED FILL — ATORVASTATIN 80 MG TABLET: 80 | 90 days supply | Qty: 90 | Fill #0

## 2019-08-28 MED FILL — ASPIRIN CHILD 81 MG TAB CHE: 81 | 90 days supply | Qty: 90 | Fill #0

## 2019-08-28 MED FILL — AMLODIPINE BESYLATE 10 MG T: 10 | 90 days supply | Qty: 90 | Fill #0

## 2019-10-02 MED FILL — metFORMIN HCL 500 MG TABS: 500 | 90 days supply | Qty: 180 | Fill #1

## 2019-10-02 MED FILL — BRILINTA 90 MG TABLET: 90 | 30 days supply | Qty: 60 | Fill #6

## 2019-10-16 MED FILL — EZETIMIBE 10 MG TABS: 10 | 90 days supply | Qty: 90 | Fill #2

## 2019-10-16 MED FILL — LISINOPRIL 40 MG TABS: 40 | 90 days supply | Qty: 90 | Fill #1

## 2019-10-26 MED FILL — BRILINTA 90 MG TABLET: 90 | 30 days supply | Qty: 60 | Fill #7

## 2019-12-03 ENCOUNTER — Other Ambulatory Visit: Payer: Self-pay | Admitting: Cardiology

## 2019-12-03 MED FILL — ASPIRIN CHILD 81 MG TAB CHE: 81 | 90 days supply | Qty: 90 | Fill #1

## 2019-12-03 MED FILL — ATORVASTATIN 80 MG TABLET: 80 | 90 days supply | Qty: 90 | Fill #1

## 2019-12-03 MED FILL — BRILINTA 90 MG TABLET: 90 | 30 days supply | Qty: 60 | Fill #0

## 2019-12-05 ENCOUNTER — Other Ambulatory Visit (HOSPITAL_COMMUNITY): Payer: Self-pay | Admitting: Cardiology

## 2019-12-05 ENCOUNTER — Other Ambulatory Visit: Payer: Self-pay | Admitting: Cardiology

## 2019-12-05 MED ORDER — AMLODIPINE BESYLATE 10 MG PO TABS: 10.0000 mg | ORAL_TABLET | Freq: Every day | ORAL | 1 refills | Status: DC

## 2019-12-05 MED ORDER — AMLODIPINE BESYLATE 10 MG PO TABS: 10.0000 mg | ORAL_TABLET | Freq: Every day | ORAL | 4 refills | Status: DC

## 2019-12-05 MED FILL — AMLODIPINE BESYLATE 10 MG T: 10 | 90 days supply | Qty: 90 | Fill #0

## 2019-12-05 NOTE — Telephone Encounter (Signed)
   *  STAT* If patient is at the pharmacy, call can be transferred to refill team.   1. Which medications need to be refilled? (please list name of each medication and dose if known)   amLODipine (NORVASC) 10 MG tablet    2. Which pharmacy/location (including street and city if local pharmacy) is medication to be sent to? [facility] - [place], [place] - [address] [facility].  3. Do they need a 30 day or 90 day supply? 90 days   Pt is completely out of medication

## 2019-12-05 NOTE — Telephone Encounter (Signed)
°*  STAT* If patient is at the pharmacy, call can be transferred to refill team.   1. Which medications need to be refilled? (please list name of each medication and dose if known)  amLODipine (NORVASC) 10 MG tablet  2. Which pharmacy/location (including street and city if local pharmacy) is medication to be sent to? [facility] - [place], [place] - [address] [facility].  3. Do they need a 30 day or 90 day supply? 30 day supply

## 2020-01-09 ENCOUNTER — Other Ambulatory Visit: Payer: Self-pay | Admitting: Cardiology

## 2020-01-09 MED FILL — METFORMIN HCL 500 MG TABS: 500 | 90 days supply | Qty: 180 | Fill #0

## 2020-01-09 MED FILL — BRILINTA 90 MG TABLET: 90 | 90 days supply | Qty: 180 | Fill #0

## 2020-01-09 NOTE — Telephone Encounter (Signed)
Rx has been sent to the pharmacy electronically. ° °

## 2020-01-14 MED FILL — EZETIMIBE 10 MG TABS: 10 | 90 days supply | Qty: 90 | Fill #3

## 2020-01-14 MED FILL — LISINOPRIL 40 MG TABS: 40 | 90 days supply | Qty: 90 | Fill #2

## 2020-02-15 NOTE — Progress Notes (Signed)
Cardiology Office Note   Date:  02/18/2020   ID:  Nas, Wafer 1960-07-14, MRN [id number]  PCP:  Orpah Melter, MD  Cardiologist:  Nikolaos Maddocks Martinique, MD   No chief complaint on file.     History of Present Illness: Timothy May is a 59 y.o. male who is seen for follow up CAD. He presented with acute inferior STEMI on 02/26/19.  He received a DES x1 to his RCA.  He had proximal to mid LAD lesion that was 20%, second marginal lesion 40%, distal circumflex 40% and 40% stenosed sidebranch in LPA V.  His PMH also includes essential hypertension, type 2 diabetes, generalized anxiety disorder, hyperlipidemia, tobacco abuse, dizziness, and depression.   On follow up today he is doing well. He has not smoked since September 2020. Weight is stable.  He denies any chest pain or dyspnea and energy level is good.  He is doing a lot of walking and works in [employer] so is physically active. He is concerned about his blood sugar- running 160-200. Wants lab work today.    Past Medical History:  Diagnosis Date  . DDD (degenerative disc disease), lumbar    followed by Dr Sherwood Gambler  . Diabetes ([place])   . Gout   . Hypercholesteremia   . Hyperlipidemia   . Hypertension   . Renal calculus or stone   . STEMI (ST elevation myocardial infarction) ([place])    02/26/19 PCI/DES to the RCA  . Tobacco abuse   . Type 2 diabetes mellitus with complication, without long-term current use of insulin ([place]) 02/27/2019    Past Surgical History:  Procedure Laterality Date  . APPENDECTOMY     20 years ago  . CORONARY STENT INTERVENTION N/A 02/26/2019   Procedure: CORONARY STENT INTERVENTION;  Surgeon: Martinique, Amos Gaber M, MD;  Location: [facility];  Service: Cardiovascular;  Laterality: N/A;  . CORONARY/GRAFT ACUTE MI REVASCULARIZATION N/A 02/26/2019   Procedure: Coronary/Graft Acute MI Revascularization;  Surgeon: Martinique, Othniel Maret M, MD;  Location: [facility];  Service: Cardiovascular;  Laterality:  N/A;  . LEFT HEART CATH AND CORONARY ANGIOGRAPHY N/A 02/26/2019   Procedure: LEFT HEART CATH AND CORONARY ANGIOGRAPHY;  Surgeon: Martinique, Rondall Radigan M, MD;  Location: [facility];  Service: Cardiovascular;  Laterality: N/A;     Current Outpatient Medications  Medication Sig Dispense Refill  . amLODipine (NORVASC) 10 MG tablet Take 1 tablet (10 mg total) by mouth daily. 30 tablet 4  . aspirin 81 MG chewable tablet CHEW AND SWALLOW 1 TABLET BY MOUTH DAILY. 90 tablet 3  . atorvastatin (LIPITOR) 80 MG tablet TAKE 1 TABLET BY MOUTH DAILY AT 6 PM. 90 tablet 3  . blood glucose meter kit and supplies Dispense based on patient and insurance preference. Use up to four times daily as directed. (FOR ICD-10 E10.9, E11.9). 1 each 0  . BRILINTA 90 MG TABS tablet TAKE 1 TABLET BY MOUTH 2 TIMES DAILY. 180 tablet 3  . clonazePAM (KLONOPIN) 0.5 MG tablet Take 0.5-1 mg by mouth 2 (two) times daily as needed for anxiety.     Marland Kitchen ezetimibe (ZETIA) 10 MG tablet Take 1 tablet (10 mg total) by mouth daily. 90 tablet 3  . lisinopril (ZESTRIL) 40 MG tablet Take 1 tablet (40 mg total) by mouth daily. 90 tablet 3  . metFORMIN (GLUCOPHAGE) 500 MG tablet Take 1 tablet (500 mg total) by mouth 2 (two) times daily with a meal. 60 tablet 1  .  metoprolol succinate (TOPROL-XL) 25 MG 24 hr tablet Take 1 tablet (25 mg total) by mouth daily. Take with or immediately following a meal. 90 tablet 3  . metoprolol succinate (TOPROL-XL) 50 MG 24 hr tablet Take 1 tablet (50 mg total) by mouth daily. 90 tablet 3  . nitroGLYCERIN (NITROSTAT) 0.4 MG SL tablet Place 1 tablet (0.4 mg total) under the tongue every 5 (five) minutes x 3 doses as needed for chest pain. 25 tablet 2   No current facility-administered medications for this visit.    Allergies:   Patient has no known allergies.    Social History:  The patient  reports that he quit smoking about 10 months ago. His smoking use included cigarettes. He has a 20.00 pack-year smoking  history. He has never used smokeless tobacco. He reports current alcohol use. He reports that he does not use drugs.   Family History:  The patient's family history includes Cancer in his mother and sister; Heart attack in his father; Heart disease in his father; Stroke in his father.    ROS:  Please see the history of present illness.   Otherwise, review of systems are positive for none.   All other systems are reviewed and negative.    PHYSICAL EXAM: VS:  Ht '5\' 11"'  (1.803 m)   Wt 242 lb 12.8 oz (110.1 kg)   BMI 33.86 kg/m  , BMI Body mass index is 33.86 kg/m. GEN: Well nourished, overweight, in no acute distress  HEENT: normal  Neck: no JVD, carotid bruits, or masses Cardiac: RRR; no murmurs, rubs, or gallops,no edema  Respiratory:  clear to auscultation bilaterally, normal work of breathing GI: soft, nontender, nondistended, + BS MS: no deformity or atrophy  Skin: warm and dry, no rash Neuro:  Strength and sensation are intact Psych: euthymic mood, full affect   EKG:  EKG is ordered today. The ekg ordered today demonstrates NSR rate 62. Normal. I have personally reviewed and interpreted this study.    Recent Labs: 02/26/2019: B Natriuretic Peptide 28.7; Hemoglobin 13.6; Platelets 127; TSH 2.862 07/27/2019: ALT 48; BUN 15; Creatinine, Ser 0.84; Potassium 4.6; Sodium 135    Lipid Panel    Component Value Date/Time   CHOL 118 07/27/2019 1009   TRIG 131 07/27/2019 1009   HDL 36 (L) 07/27/2019 1009   CHOLHDL 3.8 04/26/2019 1038   CHOLHDL 7.3 02/25/2019 2337   VLDL UNABLE TO CALCULATE IF TRIGLYCERIDE OVER 400 mg/dL 02/25/2019 2337   LDLCALC 59 07/27/2019 1009   LDLDIRECT 61 07/27/2019 1009   LDLDIRECT 146.7 (H) 02/25/2019 2337    Dated 04/26/19: cholesterol 155, triglycerides 135, HDL 41, LDL 94.  Dated 06/06/19: A1c 6.9%. LFTs normal.,   Wt Readings from Last 3 Encounters:  02/18/20 242 lb 12.8 oz (110.1 kg)  06/26/19 243 lb (110.2 kg)  04/26/19 239 lb (108.4 kg)       Other studies Reviewed: Additional studies/ records that were reviewed today include:  Echocardiogram 02/26/2019 IMPRESSIONS  1. The left ventricle has normal systolic function, with an ejection fraction of 55-60%. The cavity size was normal. There is moderately increased left ventricular wall thickness. Left ventricular diastolic Doppler parameters are consistent with  pseudonormalization. 2. There is mild mitral annular calcification present. 3. The aortic valve is tricuspid. Mild sclerosis of the aortic valve. 4. The aorta is normal unless otherwise noted. 5. The inferior vena cava was dilated in size with >50% respiratory variability  Cardiac cath 02/26/19: Procedures  Coronary/Graft Acute MI Revascularization  CORONARY STENT INTERVENTION  LEFT HEART CATH AND CORONARY ANGIOGRAPHY  Conclusion    Prox LAD to Mid LAD lesion is 20% stenosed.  2nd Mrg lesion is 40% stenosed.  Dist Cx lesion is 40% stenosed with 40% stenosed side branch in LPAV.  Prox RCA lesion is 100% stenosed.  Post intervention, there is a 0% residual stenosis.  A drug-eluting stent was successfully placed using a STENT SYNERGY DES 2.5X16.  The left ventricular systolic function is normal.  LV end diastolic pressure is normal.  The left ventricular ejection fraction is 55-65% by visual estimate.   1. 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 Normal LV function 3. Normal LVEDP 4. Successful PCI of the RCA with DES x 1  Plan: DAPT for one year. Patient is a candidate for fast track DC.        ASSESSMENT AND PLAN:  1. CAD s/p inferior STEMI on 02/26/19. DES of RCA x 1. No other obstructive disease. Normal LV function. May discontinue Brilinta next week. Continue other therapy  2. HTN- well controlled. Continue amlodipine, lisinopril, Toprol XL.  3. Hyperlipidemia- on high dose statin and Zetia. LDL was at goal with LDL 61. Will update labs today.   4. DM type 2. On  metformin 500 mg bid. Concerned that sugars are higher. Will check labs today including A1c. If additional therapy is warranted would favor a GLP1 or SGLT2 inhibitor for CV benefits.   Current medicines are reviewed at length with the patient today.  The patient does not have concerns regarding medicines.  The following changes have been made:  See above  Labs/ tests ordered today include:  No orders of the defined types were placed in this encounter.    Disposition:   FU with me in 6 months  Signed, Nayleah Gamel Martinique, MD  02/18/2020 11:17 AM    [facility] [address] [place], [place], [address] Phone [phone], Fax [phone]

## 2020-02-18 ENCOUNTER — Encounter: Payer: Self-pay | Admitting: Cardiology

## 2020-02-18 ENCOUNTER — Other Ambulatory Visit: Payer: Self-pay

## 2020-02-18 ENCOUNTER — Ambulatory Visit: Payer: 59 | Admitting: Cardiology

## 2020-02-18 VITALS — BP 138/70 | HR 62 | Ht 71.0 in | Wt 242.8 lb

## 2020-02-18 DIAGNOSIS — E7800 Pure hypercholesterolemia, unspecified: Secondary | ICD-10-CM | POA: Diagnosis not present

## 2020-02-18 DIAGNOSIS — E118 Type 2 diabetes mellitus with unspecified complications: Secondary | ICD-10-CM | POA: Diagnosis not present

## 2020-02-18 DIAGNOSIS — Z720 Tobacco use: Secondary | ICD-10-CM

## 2020-02-18 DIAGNOSIS — I2510 Atherosclerotic heart disease of native coronary artery without angina pectoris: Secondary | ICD-10-CM | POA: Diagnosis not present

## 2020-02-18 DIAGNOSIS — I10 Essential (primary) hypertension: Secondary | ICD-10-CM

## 2020-02-18 NOTE — Addendum Note (Signed)
Addended by: Neoma Laming on: 02/18/2020 11:54 AM   Modules accepted: Orders

## 2020-02-18 NOTE — Patient Instructions (Signed)
Stop Brilinta in one week

## 2020-02-19 LAB — MICROALBUMIN / CREATININE URINE RATIO
Creatinine, Urine: 124.6 mg/dL
Microalb/Creat Ratio: 27 mg/g creat (ref 0–29)
Microalbumin, Urine: 34.1 ug/mL

## 2020-02-19 LAB — BASIC METABOLIC PANEL
BUN/Creatinine Ratio: 16 (ref 9–20)
BUN: 16 mg/dL (ref 6–24)
CO2: 21 mmol/L (ref 20–29)
Calcium: 9.9 mg/dL (ref 8.7–10.2)
Chloride: 101 mmol/L (ref 96–106)
Creatinine, Ser: 0.97 mg/dL (ref 0.76–1.27)
GFR calc Af Amer: 100 mL/min/{1.73_m2} (ref >59)
GFR calc non Af Amer: 86 mL/min/{1.73_m2} (ref >59)
Glucose: 126 mg/dL — ABNORMAL HIGH (ref 65–99)
Potassium: 4.9 mmol/L (ref 3.5–5.2)
Sodium: 136 mmol/L (ref 134–144)

## 2020-02-19 LAB — HEPATIC FUNCTION PANEL
ALT: 29 IU/L (ref 0–44)
AST: 20 IU/L (ref 0–40)
Albumin: 4.7 g/dL (ref 3.8–4.9)
Alkaline Phosphatase: 91 IU/L (ref 48–121)
Bilirubin Total: 0.3 mg/dL (ref 0.0–1.2)
Bilirubin, Direct: 0.09 mg/dL (ref 0.00–0.40)
Total Protein: 7.3 g/dL (ref 6.0–8.5)

## 2020-02-19 LAB — HEMOGLOBIN A1C
Est. average glucose Bld gHb Est-mCnc: 157 mg/dL
Hgb A1c MFr Bld: 7.1 % — ABNORMAL HIGH (ref 4.8–5.6)

## 2020-02-19 LAB — LIPID PANEL
Chol/HDL Ratio: 3.4 ratio (ref 0.0–5.0)
Cholesterol, Total: 123 mg/dL (ref 100–199)
HDL: 36 mg/dL — ABNORMAL LOW (ref >39)
LDL Chol Calc (NIH): 68 mg/dL (ref 0–99)
Triglycerides: 100 mg/dL (ref 0–149)
VLDL Cholesterol Cal: 19 mg/dL (ref 5–40)

## 2020-02-21 MED FILL — METOPROLOL SUCCINATE ER 50: 50 | 90 days supply | Qty: 90 | Fill #1

## 2020-02-21 MED FILL — ASPIRIN CHILD 81 MG TAB CHE: 81 | 90 days supply | Qty: 90 | Fill #2

## 2020-03-11 ENCOUNTER — Ambulatory Visit: Payer: 59 | Admitting: Podiatry

## 2020-04-29 ENCOUNTER — Other Ambulatory Visit: Payer: Self-pay | Admitting: Adult Health

## 2020-04-29 ENCOUNTER — Other Ambulatory Visit (HOSPITAL_COMMUNITY): Payer: Self-pay | Admitting: Family Medicine

## 2020-04-29 ENCOUNTER — Other Ambulatory Visit: Payer: Self-pay | Admitting: Cardiology

## 2020-04-29 MED FILL — AMLODIPINE BESYLATE 10 MG T: 10 | 90 days supply | Qty: 90 | Fill #1

## 2020-04-29 MED FILL — METFORMIN HCL 500 MG TABS: 500 | 90 days supply | Qty: 180 | Fill #0

## 2020-04-29 MED FILL — TADALAFIL 20 MG TABS: 20 | 90 days supply | Qty: 18 | Fill #1

## 2020-04-29 MED FILL — ATORVASTATIN 80 MG TABLET: 80 | 90 days supply | Qty: 90 | Fill #2

## 2020-04-30 ENCOUNTER — Other Ambulatory Visit (HOSPITAL_COMMUNITY): Payer: Self-pay | Admitting: Cardiology

## 2020-04-30 MED FILL — LISINOPRIL 40 MG TABS: 40 | 90 days supply | Qty: 90 | Fill #0

## 2020-04-30 MED FILL — EZETIMIBE 10 MG TABS: 10 | 90 days supply | Qty: 90 | Fill #0

## 2020-05-12 ENCOUNTER — Other Ambulatory Visit (HOSPITAL_COMMUNITY): Payer: Self-pay | Admitting: Family Medicine

## 2020-05-12 DIAGNOSIS — E1159 Type 2 diabetes mellitus with other circulatory complications: Secondary | ICD-10-CM | POA: Diagnosis not present

## 2020-05-12 DIAGNOSIS — Z794 Long term (current) use of insulin: Secondary | ICD-10-CM | POA: Diagnosis not present

## 2020-05-12 DIAGNOSIS — I10 Essential (primary) hypertension: Secondary | ICD-10-CM | POA: Diagnosis not present

## 2020-05-12 DIAGNOSIS — E785 Hyperlipidemia, unspecified: Secondary | ICD-10-CM | POA: Diagnosis not present

## 2020-05-12 DIAGNOSIS — F419 Anxiety disorder, unspecified: Secondary | ICD-10-CM | POA: Diagnosis not present

## 2020-05-12 MED FILL — clonazePAM 1 MG TABS: 1 | 30 days supply | Qty: 60 | Fill #0

## 2020-05-22 ENCOUNTER — Encounter (INDEPENDENT_AMBULATORY_CARE_PROVIDER_SITE_OTHER): Payer: 59 | Admitting: Ophthalmology

## 2020-06-09 MED FILL — ASPIRIN CHILD 81 MG TAB CHE: 81 | 90 days supply | Qty: 90 | Fill #3

## 2020-06-09 MED FILL — METOPROLOL SUCCINATE ER 50: 50 | 90 days supply | Qty: 90 | Fill #2

## 2020-06-09 MED FILL — clonazePAM 1 MG TABS: 1 | 30 days supply | Qty: 60 | Fill #0

## 2020-06-16 DIAGNOSIS — H5203 Hypermetropia, bilateral: Secondary | ICD-10-CM | POA: Diagnosis not present

## 2020-06-16 DIAGNOSIS — H52223 Regular astigmatism, bilateral: Secondary | ICD-10-CM | POA: Diagnosis not present

## 2020-06-16 DIAGNOSIS — H524 Presbyopia: Secondary | ICD-10-CM | POA: Diagnosis not present

## 2020-06-16 DIAGNOSIS — E119 Type 2 diabetes mellitus without complications: Secondary | ICD-10-CM | POA: Diagnosis not present

## 2020-06-26 ENCOUNTER — Ambulatory Visit: Payer: 59 | Admitting: Podiatry

## 2020-06-26 ENCOUNTER — Other Ambulatory Visit: Payer: Self-pay

## 2020-06-26 ENCOUNTER — Ambulatory Visit (INDEPENDENT_AMBULATORY_CARE_PROVIDER_SITE_OTHER): Payer: 59

## 2020-06-26 DIAGNOSIS — L02619 Cutaneous abscess of unspecified foot: Secondary | ICD-10-CM

## 2020-06-26 DIAGNOSIS — M79671 Pain in right foot: Secondary | ICD-10-CM | POA: Diagnosis not present

## 2020-06-26 DIAGNOSIS — L03115 Cellulitis of right lower limb: Secondary | ICD-10-CM | POA: Diagnosis not present

## 2020-06-26 DIAGNOSIS — L03119 Cellulitis of unspecified part of limb: Secondary | ICD-10-CM | POA: Diagnosis not present

## 2020-06-26 NOTE — Progress Notes (Signed)
Dg  

## 2020-06-27 ENCOUNTER — Other Ambulatory Visit: Payer: Self-pay | Admitting: Podiatry

## 2020-06-27 ENCOUNTER — Telehealth: Payer: Self-pay | Admitting: Podiatry

## 2020-06-27 ENCOUNTER — Other Ambulatory Visit (HOSPITAL_COMMUNITY): Payer: Self-pay | Admitting: Podiatry

## 2020-06-27 MED ORDER — DOXYCYCLINE HYCLATE 100 MG PO TABS: 100.0000 mg | ORAL_TABLET | Freq: Two times a day (BID) | ORAL | 0 refills | Status: DC

## 2020-06-27 MED FILL — DOXYCYCLINE HYCLATE 100 MG: 100 | 10 days supply | Qty: 20 | Fill #0

## 2020-06-27 NOTE — Telephone Encounter (Signed)
Patient stated he was suppose to have antibiotic called in yesterday but hasn't been called in yet, Please advise

## 2020-06-27 NOTE — Progress Notes (Signed)
Subjective:   Patient ID: Timothy May, male   DOB: 60 y.o.   MRN: [id number]   HPI Patient presents stating his had a lot of pain in the bottom of his right foot and this occurred around 3 months ago and then it got better with an antibiotic that he borrowed and then it started back again in the last week   Review of Systems  All other systems reviewed and are negative.       Objective:  Physical Exam Vitals and nursing note reviewed.  Constitutional:      Appearance: He is well-developed and well-nourished.  Cardiovascular:     Pulses: Intact distal pulses.  Pulmonary:     Effort: Pulmonary effort is normal.  Musculoskeletal:        General: Normal range of motion.  Skin:    General: Skin is warm.  Neurological:     Mental Status: He is alert.     Neurovascular status intact muscle strength adequate range of motion adequate patient found to have an area on the bottom of the right foot that is irritated with no drainage on the surface no erythema no edema no proximal calor noted.  It is painful when palpated     Assessment:  Stability that this may be a small abscess versus keratotic lesion     Plan:  H&P precautionary x-ray done and I did go ahead and use it and she will start debridement I opened the area up and discovered it was a localized abscess and I then opened it up completely flush the area did a culture of this before flushing it and I then applied dressing with Neosporin with padding and instructed how to pad it and soak it and placed on doxycycline.  I am sending this off for evaluation culture and sensitivity and will see back in 1 week and gave strict instructions of any increased redness swelling pain that he may need to go to the emergency room  X-ray taken right foot negative for signs of osteolysis or indications of osteomyelitic changes

## 2020-06-27 NOTE — Telephone Encounter (Signed)
Will be there in 30 minutes at pharmacy

## 2020-06-30 LAB — WOUND CULTURE
MICRO NUMBER:: 11390288
SPECIMEN QUALITY:: ADEQUATE

## 2020-07-03 ENCOUNTER — Encounter: Payer: Self-pay | Admitting: Podiatry

## 2020-07-03 ENCOUNTER — Other Ambulatory Visit: Payer: Self-pay

## 2020-07-03 ENCOUNTER — Ambulatory Visit: Payer: 59 | Admitting: Podiatry

## 2020-07-03 DIAGNOSIS — L02619 Cutaneous abscess of unspecified foot: Secondary | ICD-10-CM | POA: Diagnosis not present

## 2020-07-03 DIAGNOSIS — L03119 Cellulitis of unspecified part of limb: Secondary | ICD-10-CM

## 2020-07-07 NOTE — Progress Notes (Signed)
Subjective:   Patient ID: Timothy May, male   DOB: 60 y.o.   MRN: [id number]   HPI Patient presents stating he is doing much better and so far seems to be having a very good response to treatment   ROS      Objective:  Physical Exam  Neurovascular status intact with patient's right plantar foot showing a small area still present but no opening no erythema no edema no drainage noted in the area or surrounding     Assessment:  Improving dramatically from abscess subright first metatarsal     Plan:  Advised on soaks and utilization of continued antibiotics until complete.  If any issues were to occur let us know immediately

## 2020-07-14 MED FILL — METFORMIN HCL 500 MG TABS: 500 | 90 days supply | Qty: 270 | Fill #0

## 2020-07-25 NOTE — Progress Notes (Signed)
Cardiology Office Note   Date:  07/28/2020   ID:  Timothy May, DOB 1960-10-16, MRN [id number]  PCP:  Orpah Melter, MD  Cardiologist:  Peter Martinique, MD   Chief Complaint  Patient presents with  . Coronary Artery Disease      History of Present Illness: Timothy May is a 60 y.o. male who is seen for follow up CAD. He presented with acute inferior STEMI on 02/26/19.  He received a DES x1 to his RCA.  He had proximal to mid LAD lesion that was 20%, second marginal lesion 40%, distal circumflex 40% and 40% stenosed sidebranch in LPA V.  His PMH also includes essential hypertension, type 2 diabetes, generalized anxiety disorder, hyperlipidemia, tobacco abuse, dizziness, and depression.   On follow up today he is doing well. He has not smoked since September 2020. Weight is stable.  He denies any chest pain or dyspnea and energy level is good.  He is physically active. Last A1c 7%.     Past Medical History:  Diagnosis Date  . DDD (degenerative disc disease), lumbar    followed by Dr Sherwood Gambler  . Diabetes ([place])   . Gout   . Hypercholesteremia   . Hyperlipidemia   . Hypertension   . Renal calculus or stone   . STEMI (ST elevation myocardial infarction) ([place])    02/26/19 PCI/DES to the RCA  . Tobacco abuse   . Type 2 diabetes mellitus with complication, without long-term current use of insulin ([place]) 02/27/2019    Past Surgical History:  Procedure Laterality Date  . APPENDECTOMY     20 years ago  . CORONARY STENT INTERVENTION N/A 02/26/2019   Procedure: CORONARY STENT INTERVENTION;  Surgeon: Martinique, Peter M, MD;  Location: [facility];  Service: Cardiovascular;  Laterality: N/A;  . CORONARY/GRAFT ACUTE MI REVASCULARIZATION N/A 02/26/2019   Procedure: Coronary/Graft Acute MI Revascularization;  Surgeon: Martinique, Peter M, MD;  Location: [facility];  Service: Cardiovascular;  Laterality: N/A;  . LEFT HEART CATH AND CORONARY ANGIOGRAPHY N/A 02/26/2019   Procedure: LEFT  HEART CATH AND CORONARY ANGIOGRAPHY;  Surgeon: Martinique, Peter M, MD;  Location: [facility];  Service: Cardiovascular;  Laterality: N/A;     Current Outpatient Medications  Medication Sig Dispense Refill  . amLODipine (NORVASC) 10 MG tablet Take 1 tablet (10 mg total) by mouth daily. 30 tablet 4  . aspirin 81 MG chewable tablet CHEW AND SWALLOW 1 TABLET BY MOUTH DAILY. 90 tablet 3  . atorvastatin (LIPITOR) 80 MG tablet TAKE 1 TABLET BY MOUTH DAILY AT 6 PM. 90 tablet 3  . blood glucose meter kit and supplies Dispense based on patient and insurance preference. Use up to four times daily as directed. (FOR ICD-10 E10.9, E11.9). 1 each 0  . clonazePAM (KLONOPIN) 0.5 MG tablet Take 0.5-1 mg by mouth 2 (two) times daily as needed for anxiety.     Marland Kitchen ezetimibe (ZETIA) 10 MG tablet TAKE 1 TABLET (10 MG TOTAL) BY MOUTH DAILY. 90 tablet 3  . lisinopril (ZESTRIL) 40 MG tablet TAKE 1 TABLET BY MOUTH DAILY. 90 tablet 3  . metFORMIN (GLUCOPHAGE) 500 MG tablet Take 1 tablet (500 mg total) by mouth 2 (two) times daily with a meal. 60 tablet 1  . metoprolol succinate (TOPROL-XL) 25 MG 24 hr tablet Take 1 tablet (25 mg total) by mouth daily. Take with or immediately following a meal. 90 tablet 3  . nitroGLYCERIN (NITROSTAT) 0.4 MG SL  tablet Place 1 tablet (0.4 mg total) under the tongue every 5 (five) minutes x 3 doses as needed for chest pain. 25 tablet 2  . tadalafil (CIALIS) 20 MG tablet Take 10-20 mg by mouth daily as needed.    . metoprolol succinate (TOPROL-XL) 50 MG 24 hr tablet Take 1 tablet (50 mg total) by mouth daily. 90 tablet 3   No current facility-administered medications for this visit.    Allergies:   Patient has no known allergies.    Social History:  The patient  reports that he quit smoking about 15 months ago. His smoking use included cigarettes. He has a 20.00 pack-year smoking history. He has never used smokeless tobacco. He reports current alcohol use. He reports that he does not  use drugs.   Family History:  The patient's family history includes Cancer in his mother and sister; Heart attack in his father; Heart disease in his father; Stroke in his father.    ROS:  Please see the history of present illness.   Otherwise, review of systems are positive for none.   All other systems are reviewed and negative.    PHYSICAL EXAM: VS:  BP 134/72   Pulse 64   Ht '5\' 10"'  (1.778 m)   Wt 245 lb 12.8 oz (111.5 kg)   BMI 35.27 kg/m  , BMI Body mass index is 35.27 kg/m. GEN: Well nourished, overweight, in no acute distress  HEENT: normal  Neck: no JVD, carotid bruits, or masses Cardiac: RRR; no murmurs, rubs, or gallops,no edema  Respiratory:  clear to auscultation bilaterally, normal work of breathing GI: soft, nontender, nondistended, + BS MS: no deformity or atrophy  Skin: warm and dry, no rash Neuro:  Strength and sensation are intact Psych: euthymic mood, full affect   EKG:  EKG is not ordered today.   Recent Labs: 02/18/2020: ALT 29; BUN 16; Creatinine, Ser 0.97; Potassium 4.9; Sodium 136    Lipid Panel    Component Value Date/Time   CHOL 123 02/18/2020 1149   TRIG 100 02/18/2020 1149   HDL 36 (L) 02/18/2020 1149   CHOLHDL 3.4 02/18/2020 1149   CHOLHDL 7.3 02/25/2019 2337   VLDL UNABLE TO CALCULATE IF TRIGLYCERIDE OVER 400 mg/dL 02/25/2019 2337   LDLCALC 68 02/18/2020 1149   LDLDIRECT 61 07/27/2019 1009   LDLDIRECT 146.7 (H) 02/25/2019 2337    Dated 04/26/19: cholesterol 155, triglycerides 135, HDL 41, LDL 94.  Dated 06/06/19: A1c 6.9%. LFTs normal.,   Wt Readings from Last 3 Encounters:  07/28/20 245 lb 12.8 oz (111.5 kg)  02/18/20 242 lb 12.8 oz (110.1 kg)  06/26/19 243 lb (110.2 kg)      Other studies Reviewed: Additional studies/ records that were reviewed today include:  Echocardiogram 02/26/2019 IMPRESSIONS  1. The left ventricle has normal systolic function, with an ejection fraction of 55-60%. The cavity size was normal. There is  moderately increased left ventricular wall thickness. Left ventricular diastolic Doppler parameters are consistent with  pseudonormalization. 2. There is mild mitral annular calcification present. 3. The aortic valve is tricuspid. Mild sclerosis of the aortic valve. 4. The aorta is normal unless otherwise noted. 5. The inferior vena cava was dilated in size with >50% respiratory variability  Cardiac cath 02/26/19: Procedures  Coronary/Graft Acute MI Revascularization  CORONARY STENT INTERVENTION  LEFT HEART CATH AND CORONARY ANGIOGRAPHY  Conclusion    Prox LAD to Mid LAD lesion is 20% stenosed.  2nd Mrg lesion is 40% stenosed.  Dist Cx  lesion is 40% stenosed with 40% stenosed side branch in LPAV.  Prox RCA lesion is 100% stenosed.  Post intervention, there is a 0% residual stenosis.  A drug-eluting stent was successfully placed using a STENT SYNERGY DES 2.5X16.  The left ventricular systolic function is normal.  LV end diastolic pressure is normal.  The left ventricular ejection fraction is 55-65% by visual estimate.   1. 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 Normal LV function 3. Normal LVEDP 4. Successful PCI of the RCA with DES x 1  Plan: DAPT for one year. Patient is a candidate for fast track DC.        ASSESSMENT AND PLAN:  1. CAD s/p inferior STEMI on 02/26/19. DES of RCA x 1. No other obstructive disease. Normal LV function. Continue ASA and statin therapy  2. HTN- well controlled. Continue amlodipine, lisinopril, Toprol XL.  3. Hyperlipidemia- on high dose statin and Zetia. LDL was at goal with LDL 68. Will update labs today.   4. DM type 2. On metformin. States he is having trouble losing weight. May consider a GLp1 inhibitor. Will discuss with primary care.   Current medicines are reviewed at length with the patient today.  The patient does not have concerns regarding medicines.  The following changes have been made:  See  above  Labs/ tests ordered today include:  No orders of the defined types were placed in this encounter.    Disposition:   FU with me in 6 months  Signed, Peter Martinique, MD  07/28/2020 11:32 AM    [facility] [address], [place], [place], [address] Phone [phone], Fax [phone]

## 2020-07-28 ENCOUNTER — Other Ambulatory Visit: Payer: Self-pay

## 2020-07-28 ENCOUNTER — Ambulatory Visit: Payer: 59 | Admitting: Cardiology

## 2020-07-28 ENCOUNTER — Encounter: Payer: Self-pay | Admitting: Cardiology

## 2020-07-28 VITALS — BP 134/72 | HR 64 | Ht 70.0 in | Wt 245.8 lb

## 2020-07-28 DIAGNOSIS — E7800 Pure hypercholesterolemia, unspecified: Secondary | ICD-10-CM | POA: Diagnosis not present

## 2020-07-28 DIAGNOSIS — E118 Type 2 diabetes mellitus with unspecified complications: Secondary | ICD-10-CM | POA: Diagnosis not present

## 2020-07-28 DIAGNOSIS — I10 Essential (primary) hypertension: Secondary | ICD-10-CM

## 2020-07-28 DIAGNOSIS — I2510 Atherosclerotic heart disease of native coronary artery without angina pectoris: Secondary | ICD-10-CM | POA: Diagnosis not present

## 2020-07-30 ENCOUNTER — Other Ambulatory Visit: Payer: Self-pay | Admitting: Podiatry

## 2020-07-30 DIAGNOSIS — L03115 Cellulitis of right lower limb: Secondary | ICD-10-CM

## 2020-07-30 DIAGNOSIS — M79671 Pain in right foot: Secondary | ICD-10-CM

## 2020-07-30 MED FILL — AMLODIPINE BESYLATE 10 MG T: 10 | 30 days supply | Qty: 30 | Fill #0

## 2020-07-30 MED FILL — LISINOPRIL 40 MG TABS: 40 | 90 days supply | Qty: 90 | Fill #1

## 2020-07-30 MED FILL — TADALAFIL 20 MG TABS: 20 | 90 days supply | Qty: 18 | Fill #2

## 2020-09-22 ENCOUNTER — Other Ambulatory Visit (HOSPITAL_COMMUNITY): Payer: Self-pay

## 2020-09-22 MED FILL — Ezetimibe Tab 10 MG: ORAL | 90 days supply | Qty: 90 | Fill #0 | Status: AC

## 2020-09-22 MED FILL — Amlodipine Besylate Tab 10 MG (Base Equivalent): ORAL | 90 days supply | Qty: 90 | Fill #0 | Status: AC

## 2020-09-23 ENCOUNTER — Other Ambulatory Visit (HOSPITAL_COMMUNITY): Payer: Self-pay

## 2020-09-24 ENCOUNTER — Other Ambulatory Visit (HOSPITAL_COMMUNITY): Payer: Self-pay

## 2020-09-24 ENCOUNTER — Telehealth: Payer: Self-pay | Admitting: Cardiology

## 2020-09-24 MED ORDER — ATORVASTATIN CALCIUM 80 MG PO TABS
ORAL_TABLET | ORAL | 0 refills | Status: DC
  Filled 2020-09-24: qty 90, 90d supply, fill #0

## 2020-09-24 NOTE — Telephone Encounter (Signed)
*  STAT* If patient is at the pharmacy, call can be transferred to refill team.   1. Which medications need to be refilled? (please list name of each medication and dose if known) Metoprolol - need this asap- going out of town  2. Which pharmacy/location (including street and city if local pharmacy) is medication to be sent to? [facility]  3. Do they need a 30 day or 90 day supply? 90 days and refills

## 2020-09-25 ENCOUNTER — Other Ambulatory Visit (HOSPITAL_COMMUNITY): Payer: Self-pay

## 2020-09-25 ENCOUNTER — Telehealth: Payer: Self-pay | Admitting: Cardiology

## 2020-09-25 MED ORDER — METOPROLOL SUCCINATE ER 25 MG PO TB24
25.0000 mg | ORAL_TABLET | Freq: Every day | ORAL | 3 refills | Status: DC
Start: 2020-09-25 — End: 2020-11-18
  Filled 2020-09-25: qty 90, 90d supply, fill #0

## 2020-09-25 MED ORDER — METOPROLOL SUCCINATE ER 25 MG PO TB24
25.0000 mg | ORAL_TABLET | Freq: Every day | ORAL | 3 refills | Status: DC
Start: 2020-09-25 — End: 2020-09-25
  Filled 2020-09-25: qty 90, 90d supply, fill #0

## 2020-09-25 MED ORDER — METOPROLOL SUCCINATE ER 50 MG PO TB24
50.0000 mg | ORAL_TABLET | Freq: Every day | ORAL | 3 refills | Status: DC
Start: 2020-09-25 — End: 2020-09-25
  Filled 2020-09-25: qty 90, 90d supply, fill #0

## 2020-09-25 NOTE — Telephone Encounter (Signed)
Spoke to patient advised I received a message you need Metoprolol refilled.I need to verify dose metoprolol is listed twice on medication list  25 mg daily and 50 mg daily.Stated he is not at home at present but stated metoprolol was decreased several months ago.He only takes one daily.Dr.Jordan's 06/26/19 office visit Metoprolol decreased to 25 mg daily.Prescription sent to pharmacy.

## 2020-09-25 NOTE — Telephone Encounter (Signed)
*  STAT* If patient is at the pharmacy, call can be transferred to refill team.   1. Which medications need to be refilled? (please list name of each medication and dose if known) Metoprolol- please call if possible, going out of town in the morning  2. Which pharmacy/location (including street and city if local pharmacy) is medication to be sent to?[facility]  3. Do they need a 30 day or 90 day supply? 90 days and refills

## 2020-09-26 ENCOUNTER — Other Ambulatory Visit (HOSPITAL_COMMUNITY): Payer: Self-pay

## 2020-09-26 ENCOUNTER — Other Ambulatory Visit: Payer: Self-pay | Admitting: Cardiology

## 2020-09-26 MED ORDER — ASPIRIN 81 MG PO CHEW
81.0000 mg | CHEWABLE_TABLET | Freq: Every day | ORAL | 3 refills | Status: DC
  Filled 2020-09-26 – 2021-02-13 (×2): qty 90, 90d supply, fill #0
  Filled 2021-05-13: qty 90, 90d supply, fill #1
  Filled 2021-08-31: qty 90, 90d supply, fill #2

## 2020-09-26 MED FILL — Ezetimibe Tab 10 MG: ORAL | 90 days supply | Qty: 90 | Fill #1 | Status: CN

## 2020-10-03 ENCOUNTER — Other Ambulatory Visit (HOSPITAL_COMMUNITY): Payer: Self-pay

## 2020-10-07 ENCOUNTER — Other Ambulatory Visit (HOSPITAL_COMMUNITY): Payer: Self-pay

## 2020-10-27 ENCOUNTER — Other Ambulatory Visit (HOSPITAL_COMMUNITY): Payer: Self-pay

## 2020-10-27 MED FILL — Lisinopril Tab 40 MG: ORAL | 90 days supply | Qty: 90 | Fill #0 | Status: AC

## 2020-10-27 MED FILL — Metformin HCl Tab 500 MG: ORAL | 90 days supply | Qty: 270 | Fill #0 | Status: AC

## 2020-11-18 ENCOUNTER — Telehealth: Payer: Self-pay | Admitting: Cardiology

## 2020-11-18 ENCOUNTER — Other Ambulatory Visit (HOSPITAL_COMMUNITY): Payer: Self-pay

## 2020-11-18 MED ORDER — METOPROLOL SUCCINATE ER 50 MG PO TB24
50.0000 mg | ORAL_TABLET | Freq: Every day | ORAL | 3 refills | Status: DC
  Filled 2020-11-18: qty 90, 90d supply, fill #0
  Filled 2021-03-05: qty 90, 90d supply, fill #1

## 2020-11-18 NOTE — Telephone Encounter (Signed)
    Pt c/o medication issue:  1. Name of Medication: metoprolol succinate (TOPROL-XL) 25 MG 24 hr tablet  2. How are you currently taking this medication (dosage and times per day)? Take 1 tablet (25 mg total) by mouth daily. Take with or immediately following a meal.  3. Are you having a reaction (difficulty breathing--STAT)?   4. What is your medication issue? Pt said he's been taking 50 mg daily, he said Dr. [place] never decreased it since the refill sent last month is 25 mg he's been tacking 2 tablet a day, now he is running out of meds. He needs a prescription for 50 mg sent to his pharmacy to [facility]

## 2020-11-18 NOTE — Telephone Encounter (Signed)
Left message for pt, Refill sent to the pharmacy electronically.  

## 2020-11-20 ENCOUNTER — Other Ambulatory Visit (HOSPITAL_COMMUNITY): Payer: Self-pay

## 2020-11-21 ENCOUNTER — Other Ambulatory Visit (HOSPITAL_COMMUNITY): Payer: Self-pay

## 2020-12-03 ENCOUNTER — Other Ambulatory Visit (HOSPITAL_COMMUNITY): Payer: Self-pay

## 2020-12-04 ENCOUNTER — Other Ambulatory Visit (HOSPITAL_COMMUNITY): Payer: Self-pay

## 2020-12-05 ENCOUNTER — Other Ambulatory Visit (HOSPITAL_COMMUNITY): Payer: Self-pay

## 2020-12-05 MED ORDER — TADALAFIL 20 MG PO TABS
10.0000 mg | ORAL_TABLET | Freq: Every day | ORAL | 0 refills | Status: DC | PRN
  Filled 2020-12-05: qty 18, 90d supply, fill #0

## 2020-12-11 ENCOUNTER — Other Ambulatory Visit (HOSPITAL_COMMUNITY): Payer: Self-pay

## 2020-12-11 DIAGNOSIS — E785 Hyperlipidemia, unspecified: Secondary | ICD-10-CM | POA: Diagnosis not present

## 2020-12-11 DIAGNOSIS — Z7984 Long term (current) use of oral hypoglycemic drugs: Secondary | ICD-10-CM | POA: Diagnosis not present

## 2020-12-11 DIAGNOSIS — E1159 Type 2 diabetes mellitus with other circulatory complications: Secondary | ICD-10-CM | POA: Diagnosis not present

## 2020-12-11 DIAGNOSIS — F419 Anxiety disorder, unspecified: Secondary | ICD-10-CM | POA: Diagnosis not present

## 2020-12-11 DIAGNOSIS — I10 Essential (primary) hypertension: Secondary | ICD-10-CM | POA: Diagnosis not present

## 2020-12-11 MED ORDER — EZETIMIBE 10 MG PO TABS
10.0000 mg | ORAL_TABLET | Freq: Every day | ORAL | 1 refills | Status: DC
  Filled 2020-12-11: qty 90, 90d supply, fill #0

## 2020-12-11 MED ORDER — ATORVASTATIN CALCIUM 80 MG PO TABS
80.0000 mg | ORAL_TABLET | Freq: Every day | ORAL | 1 refills | Status: DC
  Filled 2020-12-11: qty 90, 90d supply, fill #0

## 2020-12-11 MED ORDER — KAPSPARGO SPRINKLE 50 MG PO CS24
1.0000 | EXTENDED_RELEASE_CAPSULE | ORAL | 1 refills | Status: DC
  Filled 2020-12-11: qty 90, 90d supply, fill #0

## 2020-12-11 MED ORDER — CLONAZEPAM 1 MG PO TABS
0.5000 mg | ORAL_TABLET | Freq: Two times a day (BID) | ORAL | 5 refills | Status: DC | PRN
  Filled 2020-12-11: qty 60, 30d supply, fill #0

## 2020-12-11 MED ORDER — AMLODIPINE BESYLATE 10 MG PO TABS
10.0000 mg | ORAL_TABLET | Freq: Every day | ORAL | 1 refills | Status: DC
  Filled 2020-12-11: qty 90, 90d supply, fill #0

## 2020-12-11 MED ORDER — METFORMIN HCL 500 MG PO TABS
500.0000 mg | ORAL_TABLET | Freq: Three times a day (TID) | ORAL | 1 refills | Status: DC
  Filled 2020-12-11 – 2021-02-13 (×2): qty 270, 90d supply, fill #0

## 2020-12-11 MED ORDER — LISINOPRIL 10 MG PO TABS
10.0000 mg | ORAL_TABLET | Freq: Every day | ORAL | 1 refills | Status: DC
  Filled 2020-12-11: qty 90, 90d supply, fill #0

## 2020-12-11 MED ORDER — GLUCOSE BLOOD VI STRP
ORAL_STRIP | 3 refills | Status: AC
  Filled 2020-12-11: qty 150, 75d supply, fill #0

## 2020-12-15 ENCOUNTER — Other Ambulatory Visit (HOSPITAL_COMMUNITY): Payer: Self-pay

## 2020-12-19 ENCOUNTER — Other Ambulatory Visit (HOSPITAL_COMMUNITY): Payer: Self-pay

## 2020-12-23 ENCOUNTER — Other Ambulatory Visit (HOSPITAL_COMMUNITY): Payer: Self-pay

## 2020-12-23 MED ORDER — DEXCOM G6 RECEIVER DEVI
0 refills | Status: DC
  Filled 2020-12-23: qty 1, 90d supply, fill #0

## 2020-12-23 MED ORDER — DEXCOM G6 RECEIVER DEVI
1 refills | Status: DC
  Filled 2020-12-23: qty 1, 90d supply, fill #0

## 2020-12-23 MED ORDER — DEXCOM G6 SENSOR MISC
1 refills | Status: DC
  Filled 2020-12-23 – 2021-01-05 (×2): qty 3, 30d supply, fill #0
  Filled 2021-07-27: qty 3, 30d supply, fill #1

## 2020-12-23 MED ORDER — DEXCOM G6 TRANSMITTER MISC
1 refills | Status: AC
  Filled 2020-12-23 – 2021-01-05 (×2): qty 1, 90d supply, fill #0
  Filled 2021-07-17: qty 1, 90d supply, fill #1

## 2020-12-24 ENCOUNTER — Other Ambulatory Visit (HOSPITAL_COMMUNITY): Payer: Self-pay

## 2021-01-05 ENCOUNTER — Other Ambulatory Visit (HOSPITAL_COMMUNITY): Payer: Self-pay

## 2021-02-13 ENCOUNTER — Other Ambulatory Visit (HOSPITAL_COMMUNITY): Payer: Self-pay

## 2021-03-05 ENCOUNTER — Other Ambulatory Visit (HOSPITAL_COMMUNITY): Payer: Self-pay

## 2021-03-05 MED FILL — Lisinopril Tab 40 MG: ORAL | 90 days supply | Qty: 90 | Fill #1 | Status: AC

## 2021-03-22 NOTE — Progress Notes (Addendum)
Cardiology Office Note:    Date:  03/26/2021   ID:  Timothy May, DOB Oct 29, 1960, MRN [id number]  PCP:  Orpah Melter, MD  Cardiologist:  Peter Martinique, MD  Electrophysiologist:  None   Referring MD: Orpah Melter, MD   Chief Complaint: follow-up of CAD  History of Present Illness:    Timothy May is a 60 y.o. male with a history of CAD with STEMI in 02/2019 s/p DES to RCA, hypertension, hyperlipidemia, type 2 diabetes mellitus, prior tobacco abuse (quit in 2020), and anxiety who is followed by Dr. Martinique and presents today for routine follow-up.   Patient was admitted in 02/2019 with acute inferior STEMI. Emergent cardiac catheterization showed 100% stenosis of proximal RCA with otherwise non-obstructive disease. Patient underwent PCI with DES to RCA. Echo at this time showed LVEF of 55-60% with grade 2 diastolic dysfunction. He has done well from a cardiac standpoint since then. He was last seen by Dr. Martinique in 07/2020 at which time he denied any chest pain.  Patient present today for follow-up.  Here alone.  He has done well from a cardiac standpoint since his last visit.  He denies any chest pain, shortness of breath, orthopnea, PND.  He notes minimal lower extremity edema occasionally (mostly around his ankles) if he is on his feet all day but improves by the next morning.  He also reports very mild dizziness with quick position changes but this resolves quickly.  No palpitations or syncope.  Past Medical History:  Diagnosis Date   CAD (coronary artery disease)    a. 02/2019 STEMI s/p DES to RCA   DDD (degenerative disc disease), lumbar    followed by Dr Sherwood Gambler   Gout    History of STEMI    02/26/19 PCI/DES to the RCA   Hypercholesteremia    Hyperlipidemia    Hypertension    Renal calculus or stone    Tobacco abuse    Type 2 diabetes mellitus with complication, without long-term current use of insulin ([place]) 02/27/2019    Past Surgical History:  Procedure Laterality  Date   APPENDECTOMY     20 years ago   CORONARY STENT INTERVENTION N/A 02/26/2019   Procedure: CORONARY STENT INTERVENTION;  Surgeon: Martinique, Peter M, MD;  Location: [place] [facility];  Service: Cardiovascular;  Laterality: N/A;   CORONARY/GRAFT ACUTE MI REVASCULARIZATION N/A 02/26/2019   Procedure: Coronary/Graft Acute MI Revascularization;  Surgeon: Martinique, Peter M, MD;  Location: [place] [facility];  Service: Cardiovascular;  Laterality: N/A;   LEFT HEART CATH AND CORONARY ANGIOGRAPHY N/A 02/26/2019   Procedure: LEFT HEART CATH AND CORONARY ANGIOGRAPHY;  Surgeon: Martinique, Peter M, MD;  Location: [facility];  Service: Cardiovascular;  Laterality: N/A;    Current Medications: Current Meds  Medication Sig   amLODipine (NORVASC) 10 MG tablet Take 1 tablet (10 mg total) by mouth daily.   aspirin 81 MG chewable tablet CHEW AND SWALLOW 1 TABLET BY MOUTH DAILY.   blood glucose meter kit and supplies Dispense based on patient and insurance preference. Use up to four times daily as directed. (FOR ICD-10 E10.9, E11.9).   carvedilol (COREG) 6.25 MG tablet Take 1 tablet (6.25 mg total) by mouth 2 (two) times daily.   clonazePAM (KLONOPIN) 1 MG tablet Take 0.5-1 tablets (0.5-1 mg total) by mouth 2 (two) times daily or as needed.   Continuous Blood Gluc Transmit (DEXCOM G6 TRANSMITTER) MISC Use as directed   doxycycline (VIBRA-TABS) 100 MG tablet  TAKE 1 TABLET (100 MG TOTAL) BY MOUTH 2 (TWO) TIMES DAILY.   ezetimibe (ZETIA) 10 MG tablet TAKE 1 TABLET (10 MG TOTAL) BY MOUTH DAILY.   glucose blood test strip Use 1 to 2 times daily   lisinopril (ZESTRIL) 40 MG tablet TAKE 1 TABLET BY MOUTH DAILY.   metFORMIN (GLUCOPHAGE) 500 MG tablet Take 1 tablet (500 mg total) by mouth 2 (two) times daily with a meal. (Patient taking differently: Take 500 mg by mouth 2 (two) times daily with a meal. Take 500 mg in the morning and 1000 mg in the evenings)   nitroGLYCERIN (NITROSTAT) 0.4 MG SL tablet Place 1 tablet (0.4  mg total) under the tongue every 5 (five) minutes x 3 doses as needed for chest pain.   rosuvastatin (CRESTOR) 40 MG tablet Take 1 tablet (40 mg total) by mouth daily.   tadalafil (CIALIS) 20 MG tablet Take 0.5-1 tablets (10-20 mg total) by mouth daily as needed.   [DISCONTINUED] amLODipine (NORVASC) 10 MG tablet Take 1 tablet (10 mg total) by mouth daily.   [DISCONTINUED] atorvastatin (LIPITOR) 80 MG tablet TAKE 1 TABLET BY MOUTH DAILY AT 6 PM.   [DISCONTINUED] atorvastatin (LIPITOR) 80 MG tablet Take 1 tablet (80 mg total) by mouth daily at 6 PM.   [DISCONTINUED] clonazePAM (KLONOPIN) 0.5 MG tablet Take 0.5-1 mg by mouth 2 (two) times daily as needed for anxiety.    [DISCONTINUED] Continuous Blood Gluc Receiver (DEXCOM G6 RECEIVER) DEVI Use as directed.   [DISCONTINUED] Continuous Blood Gluc Receiver (DEXCOM G6 RECEIVER) DEVI Use as directed   [DISCONTINUED] Continuous Blood Gluc Sensor (DEXCOM G6 SENSOR) MISC Use as directed every 10 days   [DISCONTINUED] ezetimibe (ZETIA) 10 MG tablet Take 1 tablet (10 mg total) by mouth daily.   [DISCONTINUED] lisinopril (ZESTRIL) 40 MG tablet TAKE 1 TABLET BY MOUTH DAILY.   [DISCONTINUED] metFORMIN (GLUCOPHAGE) 500 MG tablet Take 1 tablet (500 mg total) by mouth 3 (three) times daily.   [DISCONTINUED] metoprolol succinate (TOPROL-XL) 50 MG 24 hr tablet Take 1 tablet (50 mg total) by mouth daily. Take with or immediately following a meal.   [DISCONTINUED] tadalafil (CIALIS) 20 MG tablet Take 10-20 mg by mouth daily as needed.     Allergies:   Patient has no known allergies.   Social History   Socioeconomic History   Marital status: Married    Spouse name: Not on file   Number of children: 1   Years of education: HS   Highest education level: Not on file  Occupational History   Occupation: Architect  Tobacco Use   Smoking status: Former    Packs/day: 1.00    Years: 20.00    Pack years: 20.00    Types: Cigarettes    Quit date: 04/19/2019     Years since quitting: 1.9   Smokeless tobacco: Never  Substance and Sexual Activity   Alcohol use: Yes    Alcohol/week: 0.0 standard drinks    Comment: rare   Drug use: No   Sexual activity: Not on file  Other Topics Concern   Not on file  Social History Narrative   Lives in [place].  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   Patient is right handed.   Patient drinks 3- cups caffeine daily.   Social Determinants of Health   Financial Resource Strain: Not on file  Food Insecurity: Not on file  Transportation Needs: Not on file  Physical Activity:  Not on file  Stress: Not on file  Social Connections: Not on file     Family History: The patient's family history includes Cancer in his mother and sister; Heart attack in his father; Heart disease in his father; Stroke in his father.  ROS:   Please see the history of present illness.     EKGs/Labs/Other Studies Reviewed:    The following studies were reviewed today:  Cardiac Catheterization 02/26/2019: Prox LAD to Mid LAD lesion is 20% stenosed. 2nd Mrg lesion is 40% stenosed. Dist Cx lesion is 40% stenosed with 40% stenosed side branch in LPAV. Prox RCA lesion is 100% stenosed. Post intervention, there is a 0% residual stenosis. A drug-eluting stent was successfully placed using a STENT SYNERGY DES 2.5X16. The left ventricular systolic function is normal. LV end diastolic pressure is normal. The left ventricular ejection fraction is 55-65% by visual estimate.   1. 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 Normal LV function 3. Normal LVEDP 4. Successful PCI of the RCA with DES x 1  Plan: DAPT for one year. Patient is a candidate for fast track DC.   Diagnostic Dominance: Left Intervention    _______________  Echocardiogram 02/26/2019: Impressions:  1. The left ventricle has normal systolic function, with an ejection  fraction of  55-60%. The cavity size was normal. There is moderately  increased left ventricular wall thickness. Left ventricular diastolic  Doppler parameters are consistent with  pseudonormalization.   2. There is mild mitral annular calcification present.   3. The aortic valve is tricuspid. Mild sclerosis of the aortic valve.   4. The aorta is normal unless otherwise noted.   5. The inferior vena cava was dilated in size with >50% respiratory  variability.   EKG:  EKG ordered today. EKG personally reviewed and demonstrates sinus bradycardia, rate 59 bpm, with no acute ST/T changes. Normal axis. Normal PR and QRS intervals. QTc 407 ms. No significant changes from prior tracing.  Recent Labs: No results found for requested labs within last 8760 hours.  Recent Lipid Panel    Component Value Date/Time   CHOL 123 02/18/2020 1149   TRIG 100 02/18/2020 1149   HDL 36 (L) 02/18/2020 1149   CHOLHDL 3.4 02/18/2020 1149   CHOLHDL 7.3 02/25/2019 2337   VLDL UNABLE TO CALCULATE IF TRIGLYCERIDE OVER 400 mg/dL 02/25/2019 2337   LDLCALC 68 02/18/2020 1149   LDLDIRECT 61 07/27/2019 1009   LDLDIRECT 146.7 (H) 02/25/2019 2337    Physical Exam:    Vital Signs: BP 132/64 (BP Location: Right Arm, Patient Position: Sitting, Cuff Size: Large)   Ht '5\' 11"'  (1.803 m)   Wt 237 lb (107.5 kg)   BMI 33.05 kg/m     Wt Readings from Last 3 Encounters:  03/26/21 237 lb (107.5 kg)  07/28/20 245 lb 12.8 oz (111.5 kg)  02/18/20 242 lb 12.8 oz (110.1 kg)     General: 60 y.o. Caucasian male in no acute distress. HEENT: Normocephalic and atraumatic. Sclera clear.  Neck: Supple. No carotid bruits. No JVD. Heart: Borderline bradycardic with regular rhythm. Distinct S1 and S2. No murmurs, gallops, or rubs. Radial pulses 2+ and equal bilaterally. Lungs: No increased work of breathing. Clear to ausculation bilaterally. No wheezes, rhonchi, or rales.  Abdomen: Soft, non-distended, and non-tender to palpation.  MSK: Normal  strength and tone for age.  Extremities: No lower extremity edema.    Skin: Warm and dry. Neuro: Alert and oriented x3. No focal deficits. Psych:  Normal affect. Responds appropriately.  Assessment:    1. Coronary artery disease involving native coronary artery of native heart without angina pectoris   2. Essential hypertension   3. Hyperlipidemia, unspecified hyperlipidemia type   4. Type 2 diabetes mellitus with complication, without long-term current use of insulin ([facility])     Plan:    CAD  - History of STEMI in 02/2019 s/p DES to RCA. - No angina.  - Continue aspirin, beta-blocker, and high-intensity statin.  Hypertension - BP slightly above goal at 132/64. He states systolic BP is usually in the mid 130s at home. - Current medications: Amlodipine 42m daily, Lisinopril 443mdaily, and Toprol-XL 5084maily. Will stop Toprol-XL and switch to Coreg 6.98m47mice daily to try to get BP to goal. - Advised patient to let us [place] if BP consistently above 130/80.  Hyperlipidemia - Last lipid panel in 11/2020 (per [facility]): Total Cholesterol 143, Triglycerides 170, HDL 33, LDL 81.  - LDL goal <70 given CAD. - Will stop Lipitor 80mg35mly and switch to Crestor 40mg 72my. Continue Zetia 10mg d66m. - Will recheck lipid panel and CMET in 2-3 months. If LDL still above goal at that time, may need to consider Nexlizet.  Type 2 Diabetes Mellitus - On Metformin at home.  - Will recheck hemoglobin A1c when patient comes for labs in 2-3 months at patient's request. - Management per PCP.  Disposition: Patient already has follow-up visit with Dr. Jordan Martiniqueled in March 2023.    Medication Adjustments/Labs and Tests Ordered: Current medicines are reviewed at length with the patient today.  Concerns regarding medicines are outlined above.  Orders Placed This Encounter  Procedures   Lipid panel   Comprehensive metabolic panel   Hemoglobin A1c   EKG 12-Lead    Meds ordered this encounter   Medications   rosuvastatin (CRESTOR) 40 MG tablet    Sig: Take 1 tablet (40 mg total) by mouth daily.    Dispense:  90 tablet    Refill:  1   carvedilol (COREG) 6.25 MG tablet    Sig: Take 1 tablet (6.25 mg total) by mouth 2 (two) times daily.    Dispense:  180 tablet    Refill:  1     Patient Instructions  Medication Instructions:  STOP Atorvastatin (Lipitor) STOP Metoprolol Succinate (Toprol-XL) START Rosuvastatin (Crestor) 40 mg daily  START Carvedilol (Coreg) 6.25 mg 2 times a day  *If you need a refill on your cardiac medications before your next appointment, please call your pharmacy*  Lab Work: Your physician recommends that you return for lab work THE END OF December 2022:  Fasting Lipid Panel-DO NOT EAT OR DRINK PAST MIDNIGHT. OKAY TO HAVE WATER. CMET HgA1c  If you have labs (blood work) drawn today and your tests are completely normal, you will receive your results only by: [place]Chart[place] have MyChart) OR A paper copy in the mail If you have any lab test that is abnormal or we need to change your treatment, we will call you to review the results.  Testing/Procedures: NONE ordered at this time of appointment   Follow-Up: [facility] your health needs are our priority.  As part of our continuing mission to provide you with exceptional heart care, we have created designated Provider Care Teams.  These Care Teams include your primary Cardiologist (physician) and Advanced Practice Providers (APPs -  Physician Assistants and Nurse Practitioners) who all work together to provide you with the  care you need, when you need it.  Your next appointment:   AS SCHEDULED 08/31/21   The format for your next appointment:   In Person  Provider:   Peter Martinique, MD  Other Instructions    Signed, Darreld Mclean, PA-C  03/26/2021 8:43 AM    [place]

## 2021-03-25 ENCOUNTER — Encounter: Payer: Self-pay | Admitting: Student

## 2021-03-25 DIAGNOSIS — I2510 Atherosclerotic heart disease of native coronary artery without angina pectoris: Secondary | ICD-10-CM | POA: Insufficient documentation

## 2021-03-26 ENCOUNTER — Other Ambulatory Visit (HOSPITAL_COMMUNITY): Payer: Self-pay

## 2021-03-26 ENCOUNTER — Ambulatory Visit: Payer: 59 | Admitting: Student

## 2021-03-26 ENCOUNTER — Other Ambulatory Visit: Payer: Self-pay

## 2021-03-26 ENCOUNTER — Encounter: Payer: Self-pay | Admitting: Student

## 2021-03-26 VITALS — BP 132/64 | Ht 71.0 in | Wt 237.0 lb

## 2021-03-26 DIAGNOSIS — E785 Hyperlipidemia, unspecified: Secondary | ICD-10-CM | POA: Diagnosis not present

## 2021-03-26 DIAGNOSIS — E118 Type 2 diabetes mellitus with unspecified complications: Secondary | ICD-10-CM

## 2021-03-26 DIAGNOSIS — I10 Essential (primary) hypertension: Secondary | ICD-10-CM | POA: Diagnosis not present

## 2021-03-26 DIAGNOSIS — I2510 Atherosclerotic heart disease of native coronary artery without angina pectoris: Secondary | ICD-10-CM

## 2021-03-26 MED ORDER — CARVEDILOL 6.25 MG PO TABS
6.2500 mg | ORAL_TABLET | Freq: Two times a day (BID) | ORAL | 1 refills | Status: DC
  Filled 2021-03-26: qty 180, 90d supply, fill #0
  Filled 2021-06-26: qty 180, 90d supply, fill #1

## 2021-03-26 MED ORDER — ROSUVASTATIN CALCIUM 40 MG PO TABS
40.0000 mg | ORAL_TABLET | Freq: Every day | ORAL | 1 refills | Status: DC
  Filled 2021-03-26: qty 90, 90d supply, fill #0
  Filled 2021-06-26: qty 90, 90d supply, fill #1

## 2021-03-26 NOTE — Addendum Note (Signed)
Addended by: Dorris Fetch on: 03/26/2021 08:41 AM   Modules accepted: Orders

## 2021-03-26 NOTE — Patient Instructions (Addendum)
Medication Instructions:  STOP Atorvastatin (Lipitor) STOP Metoprolol Succinate (Toprol-XL) START Rosuvastatin (Crestor) 40 mg daily  START Carvedilol (Coreg) 6.25 mg 2 times a day  *If you need a refill on your cardiac medications before your next appointment, please call your pharmacy*  Lab Work: Your physician recommends that you return for lab work THE END OF December 2022:  Fasting Lipid Panel-DO NOT EAT OR DRINK PAST MIDNIGHT. OKAY TO HAVE WATER. CMET HgA1c  If you have labs (blood work) drawn today and your tests are completely normal, you will receive your results only by: MyChart Message (if you have MyChart) OR A paper copy in the mail If you have any lab test that is abnormal or we need to change your treatment, we will call you to review the results.  Testing/Procedures: NONE ordered at this time of appointment   Follow-Up: At [facility], you and your health needs are our priority.  As part of our continuing mission to provide you with exceptional heart care, we have created designated Provider Care Teams.  These Care Teams include your primary Cardiologist (physician) and Advanced Practice Providers (APPs -  Physician Assistants and Nurse Practitioners) who all work together to provide you with the care you need, when you need it.  Your next appointment:   AS SCHEDULED 08/31/21   The format for your next appointment:   In Person  Provider:   Peter [place], MD  Other Instructions Monitor Blood Pressure (BP) at home once a day for 1 week. If your BP is persistently greater than 130/80 please give our office a call.

## 2021-04-13 ENCOUNTER — Other Ambulatory Visit (HOSPITAL_COMMUNITY): Payer: Self-pay

## 2021-04-13 MED ORDER — AMLODIPINE BESYLATE 10 MG PO TABS
10.0000 mg | ORAL_TABLET | Freq: Every day | ORAL | 0 refills | Status: DC
  Filled 2021-04-13: qty 90, 90d supply, fill #0

## 2021-04-13 MED ORDER — METFORMIN HCL 500 MG PO TABS
500.0000 mg | ORAL_TABLET | Freq: Three times a day (TID) | ORAL | 0 refills | Status: DC
  Filled 2021-04-13 – 2021-05-13 (×3): qty 270, 90d supply, fill #0

## 2021-04-13 MED FILL — Ezetimibe Tab 10 MG: ORAL | 90 days supply | Qty: 90 | Fill #1 | Status: AC

## 2021-04-14 ENCOUNTER — Other Ambulatory Visit (HOSPITAL_COMMUNITY): Payer: Self-pay

## 2021-04-15 ENCOUNTER — Other Ambulatory Visit (HOSPITAL_COMMUNITY): Payer: Self-pay

## 2021-04-15 MED ORDER — TADALAFIL 20 MG PO TABS
20.0000 mg | ORAL_TABLET | ORAL | 1 refills | Status: DC
  Filled 2021-04-15: qty 18, 90d supply, fill #0
  Filled 2021-07-15: qty 18, 90d supply, fill #1

## 2021-05-13 ENCOUNTER — Other Ambulatory Visit (HOSPITAL_COMMUNITY): Payer: Self-pay

## 2021-06-04 DIAGNOSIS — I10 Essential (primary) hypertension: Secondary | ICD-10-CM | POA: Diagnosis not present

## 2021-06-04 DIAGNOSIS — E785 Hyperlipidemia, unspecified: Secondary | ICD-10-CM | POA: Diagnosis not present

## 2021-06-04 DIAGNOSIS — E118 Type 2 diabetes mellitus with unspecified complications: Secondary | ICD-10-CM | POA: Diagnosis not present

## 2021-06-04 LAB — COMPREHENSIVE METABOLIC PANEL
ALT: 28 IU/L (ref 0–44)
AST: 19 IU/L (ref 0–40)
Albumin/Globulin Ratio: 2.4 — ABNORMAL HIGH (ref 1.2–2.2)
Albumin: 4.6 g/dL (ref 3.8–4.9)
Alkaline Phosphatase: 65 IU/L (ref 44–121)
BUN/Creatinine Ratio: 15 (ref 9–20)
BUN: 12 mg/dL (ref 6–24)
Bilirubin Total: 0.2 mg/dL (ref 0.0–1.2)
CO2: 24 mmol/L (ref 20–29)
Calcium: 9.7 mg/dL (ref 8.7–10.2)
Chloride: 103 mmol/L (ref 96–106)
Creatinine, Ser: 0.8 mg/dL (ref 0.76–1.27)
Globulin, Total: 1.9 g/dL (ref 1.5–4.5)
Glucose: 132 mg/dL — ABNORMAL HIGH (ref 70–99)
Potassium: 5 mmol/L (ref 3.5–5.2)
Sodium: 139 mmol/L (ref 134–144)
Total Protein: 6.5 g/dL (ref 6.0–8.5)
eGFR: 103 mL/min/{1.73_m2} (ref >59)

## 2021-06-04 LAB — LIPID PANEL
Chol/HDL Ratio: 2.6 ratio (ref 0.0–5.0)
Cholesterol, Total: 95 mg/dL — ABNORMAL LOW (ref 100–199)
HDL: 37 mg/dL — ABNORMAL LOW (ref >39)
LDL Chol Calc (NIH): 40 mg/dL (ref 0–99)
Triglycerides: 89 mg/dL (ref 0–149)
VLDL Cholesterol Cal: 18 mg/dL (ref 5–40)

## 2021-06-05 LAB — HEMOGLOBIN A1C
Est. average glucose Bld gHb Est-mCnc: 160 mg/dL
Hgb A1c MFr Bld: 7.2 % — ABNORMAL HIGH (ref 4.8–5.6)

## 2021-06-26 ENCOUNTER — Telehealth: Payer: Self-pay | Admitting: Cardiology

## 2021-06-26 ENCOUNTER — Other Ambulatory Visit: Payer: Self-pay | Admitting: Cardiology

## 2021-06-26 ENCOUNTER — Other Ambulatory Visit (HOSPITAL_COMMUNITY): Payer: Self-pay

## 2021-06-26 MED ORDER — LISINOPRIL 40 MG PO TABS
40.0000 mg | ORAL_TABLET | Freq: Every day | ORAL | 3 refills | Status: DC
  Filled 2021-06-26: qty 90, 90d supply, fill #0
  Filled 2021-10-02: qty 90, 90d supply, fill #1
  Filled 2021-12-29: qty 90, 90d supply, fill #2
  Filled 2022-04-06: qty 90, 90d supply, fill #3

## 2021-06-26 NOTE — Telephone Encounter (Signed)
Refill request that came in earlier today was denied Patient called in to f/u Unclear why was denied - last seen 03/2021 Refilled to preferred pharmacy

## 2021-06-26 NOTE — Addendum Note (Signed)
Addended by: Lindell Spar on: 06/26/2021 03:53 PM   Modules accepted: Orders

## 2021-06-29 ENCOUNTER — Other Ambulatory Visit (HOSPITAL_COMMUNITY): Payer: Self-pay

## 2021-06-29 DIAGNOSIS — E785 Hyperlipidemia, unspecified: Secondary | ICD-10-CM | POA: Diagnosis not present

## 2021-06-29 DIAGNOSIS — I2510 Atherosclerotic heart disease of native coronary artery without angina pectoris: Secondary | ICD-10-CM | POA: Diagnosis not present

## 2021-06-29 DIAGNOSIS — R918 Other nonspecific abnormal finding of lung field: Secondary | ICD-10-CM | POA: Diagnosis not present

## 2021-06-29 DIAGNOSIS — E1159 Type 2 diabetes mellitus with other circulatory complications: Secondary | ICD-10-CM | POA: Diagnosis not present

## 2021-06-29 DIAGNOSIS — F419 Anxiety disorder, unspecified: Secondary | ICD-10-CM | POA: Diagnosis not present

## 2021-06-29 DIAGNOSIS — I10 Essential (primary) hypertension: Secondary | ICD-10-CM | POA: Diagnosis not present

## 2021-06-29 MED ORDER — PIOGLITAZONE HCL-METFORMIN HCL 15-500 MG PO TABS
1.0000 | ORAL_TABLET | Freq: Two times a day (BID) | ORAL | 3 refills | Status: DC
  Filled 2021-06-29: qty 60, 30d supply, fill #0
  Filled 2021-07-26: qty 60, 30d supply, fill #1
  Filled 2021-08-31: qty 60, 30d supply, fill #2
  Filled 2021-10-02: qty 60, 30d supply, fill #3

## 2021-06-29 MED ORDER — CLONAZEPAM 1 MG PO TABS
0.5000 mg | ORAL_TABLET | Freq: Two times a day (BID) | ORAL | 5 refills | Status: DC | PRN
  Filled 2021-06-29: qty 60, 30d supply, fill #0

## 2021-06-30 ENCOUNTER — Other Ambulatory Visit (HOSPITAL_COMMUNITY): Payer: Self-pay

## 2021-07-13 DIAGNOSIS — R918 Other nonspecific abnormal finding of lung field: Secondary | ICD-10-CM | POA: Diagnosis not present

## 2021-07-13 DIAGNOSIS — J439 Emphysema, unspecified: Secondary | ICD-10-CM | POA: Diagnosis not present

## 2021-07-15 ENCOUNTER — Other Ambulatory Visit (HOSPITAL_COMMUNITY): Payer: Self-pay

## 2021-07-15 ENCOUNTER — Other Ambulatory Visit: Payer: Self-pay | Admitting: Cardiology

## 2021-07-16 ENCOUNTER — Other Ambulatory Visit (HOSPITAL_COMMUNITY): Payer: Self-pay

## 2021-07-16 MED ORDER — EZETIMIBE 10 MG PO TABS
10.0000 mg | ORAL_TABLET | Freq: Every day | ORAL | 0 refills | Status: DC
  Filled 2021-07-16: qty 90, 90d supply, fill #0

## 2021-07-16 MED ORDER — AMLODIPINE BESYLATE 10 MG PO TABS
10.0000 mg | ORAL_TABLET | Freq: Every day | ORAL | 1 refills | Status: DC
  Filled 2021-07-16: qty 90, 90d supply, fill #0
  Filled 2021-10-02 – 2021-11-06 (×2): qty 90, 90d supply, fill #1

## 2021-07-17 ENCOUNTER — Other Ambulatory Visit (HOSPITAL_COMMUNITY): Payer: Self-pay

## 2021-07-20 ENCOUNTER — Other Ambulatory Visit (HOSPITAL_COMMUNITY): Payer: Self-pay

## 2021-07-21 ENCOUNTER — Other Ambulatory Visit (HOSPITAL_COMMUNITY): Payer: Self-pay

## 2021-07-21 MED ORDER — CARESTART COVID-19 HOME TEST VI KIT
PACK | 0 refills | Status: DC
  Filled 2021-07-21: qty 4, 4d supply, fill #0

## 2021-07-27 ENCOUNTER — Other Ambulatory Visit (HOSPITAL_COMMUNITY): Payer: Self-pay

## 2021-08-26 NOTE — Progress Notes (Signed)
Cardiology Office Note   Date:  08/31/2021   ID:  Timothy May, DOB Apr 12, 1961, MRN [id number]  PCP:  Orpah Melter, MD  Cardiologist:  Shayana Hornstein Martinique, MD   Chief Complaint  Patient presents with   Coronary Artery Disease      History of Present Illness: Timothy May is a 61 y.o. male who is seen for follow up CAD. He presented with acute inferior STEMI on 02/26/19.  He received a DES x1 to his RCA.  He had proximal to mid LAD lesion that was 20%, second marginal lesion 40%, distal circumflex 40% and 40% stenosed sidebranch in LPA V.  His PMH also includes essential hypertension, type 2 diabetes, generalized anxiety disorder, hyperlipidemia, tobacco abuse, dizziness, and depression.   On follow up today he is doing well. He has not smoked since September 2020. He has been eating much healthier and has lost 11 lbs. He is now on combination of metformin and Actos.  He denies any chest pain or dyspnea and energy level is good.  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    Past Medical History:  Diagnosis Date   CAD (coronary artery disease)    a. 02/2019 STEMI s/p DES to RCA   DDD (degenerative disc disease), lumbar    followed by Dr Sherwood Gambler   Gout    History of STEMI    02/26/19 PCI/DES to the RCA   Hypercholesteremia    Hyperlipidemia    Hypertension    Renal calculus or stone    Tobacco abuse    Type 2 diabetes mellitus with complication, without long-term current use of insulin ([place]) 02/27/2019    Past Surgical History:  Procedure Laterality Date   APPENDECTOMY     20 years ago   CORONARY STENT INTERVENTION N/A 02/26/2019   Procedure: CORONARY STENT INTERVENTION;  Surgeon: Martinique, Raidon Swanner M, MD;  Location: [facility];  Service: Cardiovascular;  Laterality: N/A;   CORONARY/GRAFT ACUTE MI REVASCULARIZATION N/A 02/26/2019   Procedure: Coronary/Graft Acute MI Revascularization;  Surgeon: Martinique, Massiah Longanecker M, MD;  Location: [facility];  Service: Cardiovascular;  Laterality: N/A;   LEFT HEART CATH AND CORONARY ANGIOGRAPHY N/A 02/26/2019   Procedure: LEFT HEART CATH AND CORONARY ANGIOGRAPHY;  Surgeon: Martinique, Timouthy Gilardi M, MD;  Location: [facility];  Service: Cardiovascular;  Laterality: N/A;     Current Outpatient Medications  Medication Sig Dispense Refill   amLODipine (NORVASC) 10 MG tablet Take 1 tablet (10 mg total) by mouth daily. 90 tablet 1   aspirin 81 MG chewable tablet CHEW AND SWALLOW 1 TABLET BY MOUTH DAILY. 90 tablet 3   blood glucose meter kit and supplies Dispense based on patient and insurance preference. Use up to four times daily as directed. (FOR ICD-10 E10.9, E11.9). 1 each 0   carvedilol (COREG) 6.25 MG tablet Take 1 tablet (6.25 mg total) by mouth 2 (two) times daily. 180 tablet 1   clonazePAM (KLONOPIN) 1 MG tablet Take 0.5-1 tablets (0.5-1 mg total) by mouth 2 (two) times daily or as needed. 60 tablet 5   Continuous Blood Gluc Sensor (DEXCOM G6 SENSOR) MISC Use as directed every 10 days 3 each 1   COVID-19 At Home Antigen Test (CARESTART COVID-19 HOME TEST) KIT Use as directed within package instructions 4 each 0   ezetimibe (ZETIA) 10 MG tablet Take 1 tablet (10 mg total) by mouth daily. 90 tablet 0   glucose  blood test strip Use 1 to 2 times daily 200 each 3   lisinopril (ZESTRIL) 40 MG tablet Take 1 tablet (40 mg total) by mouth daily. 90 tablet 3   pioglitazone-metformin (ACTOPLUS MET) 15-500 MG tablet Take 1 tablet by mouth 2 (two) times daily with a meal. 60 tablet 3   rosuvastatin (CRESTOR) 40 MG tablet Take 1 tablet (40 mg total) by mouth daily. 90 tablet 1   tadalafil (CIALIS) 20 MG tablet Take 0.5-1 tablets (10-20 mg total) by mouth daily as needed. 18 tablet 1   clonazePAM (KLONOPIN) 1 MG tablet Take 0.5-1 tablets (0.5-1 mg total) by mouth 2 (two) times daily or as needed. (Patient not taking: Reported on 08/31/2021) 60 tablet 5   Continuous Blood Gluc Transmit (DEXCOM G6 TRANSMITTER)  MISC Use as directed (Patient not taking: Reported on 08/31/2021) 1 each 1   nitroGLYCERIN (NITROSTAT) 0.4 MG SL tablet Place 1 tablet (0.4 mg total) under the tongue every 5 (five) minutes x 3 doses as needed for chest pain. 25 tablet 2   No current facility-administered medications for this visit.    Allergies:   Patient has no known allergies.    Social History:  The patient  reports that he quit smoking about 2 years ago. His smoking use included cigarettes. He has a 20.00 pack-year smoking history. He has never used smokeless tobacco. He reports current alcohol use. He reports that he does not use drugs.   Family History:  The patient's family history includes Cancer in his mother and sister; Heart attack in his father; Heart disease in his father; Stroke in his father.    ROS:  Please see the history of present illness.   Otherwise, review of systems are positive for none.   All other systems are reviewed and negative.    PHYSICAL EXAM: VS:  BP 126/74    Pulse (!) 51    Ht _0  (1.778 m)    Wt 226 lb 3.2 oz (102.6 kg)    BMI 32.46 kg/m  , BMI Body mass index is 32.46 kg/m. GEN: Well nourished, overweight, in no acute distress  HEENT: normal  Neck: no JVD, carotid bruits, or masses Cardiac: RRR; no murmurs, rubs, or gallops,no edema  Respiratory:  clear to auscultation bilaterally, normal work of breathing GI: soft, nontender, nondistended, + BS MS: no deformity or atrophy  Skin: warm and dry, no rash Neuro:  Strength and sensation are intact Psych: euthymic mood, full affect   EKG:  EKG is  ordered today. NSR rate 51. Normal. I have personally reviewed and interpreted this study.    Recent Labs: 06/04/2021: ALT 28; BUN 12; Creatinine, Ser 0.80; Potassium 5.0; Sodium 139    Lipid Panel    Component Value Date/Time   CHOL 95 (L) 06/04/2021 0830   TRIG 89 06/04/2021 0830   HDL 37 (L) 06/04/2021 0830   CHOLHDL 2.6 06/04/2021 0830   CHOLHDL 7.3 02/25/2019 2337    VLDL UNABLE TO CALCULATE IF TRIGLYCERIDE OVER 400 mg/dL 02/25/2019 2337   LDLCALC 40 06/04/2021 0830   LDLDIRECT 61 07/27/2019 1009   LDLDIRECT 146.7 (H) 02/25/2019 2337    Dated 04/26/19: cholesterol 155, triglycerides 135, HDL 41, LDL 94.  Dated 06/06/19: A1c 6.9%. LFTs normal.,   Wt Readings from Last 3 Encounters:  08/31/21 226 lb 3.2 oz (102.6 kg)  03/26/21 237 lb (107.5 kg)  07/28/20 245 lb 12.8 oz (111.5 kg)      Other studies Reviewed: Additional studies/  records that were reviewed today include:  Echocardiogram 02/26/2019 IMPRESSIONS    1. The left ventricle has normal systolic function, with an ejection fraction of 55-60%. The cavity size was normal. There is moderately increased left ventricular wall thickness. Left ventricular diastolic Doppler parameters are consistent with  pseudonormalization.  2. There is mild mitral annular calcification present.  3. The aortic valve is tricuspid. Mild sclerosis of the aortic valve.  4. The aorta is normal unless otherwise noted.  5. The inferior vena cava was dilated in size with >50% respiratory variability  Cardiac cath 02/26/19: Procedures  Coronary/Graft Acute MI Revascularization  CORONARY STENT INTERVENTION  LEFT HEART CATH AND CORONARY ANGIOGRAPHY  Conclusion    Prox LAD to Mid LAD lesion is 20% stenosed. 2nd Mrg lesion is 40% stenosed. Dist Cx lesion is 40% stenosed with 40% stenosed side branch in LPAV. Prox RCA lesion is 100% stenosed. Post intervention, there is a 0% residual stenosis. A drug-eluting stent was successfully placed using a STENT SYNERGY DES 2.5X16. The left ventricular systolic function is normal. LV end diastolic pressure is normal. The left ventricular ejection fraction is 55-65% by visual estimate.   1. 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 Normal LV function 3. Normal LVEDP 4. Successful PCI of the RCA with DES x 1   Plan: DAPT for one year. Patient is a candidate for  fast track DC.         ASSESSMENT AND PLAN:  1. CAD s/p inferior STEMI on 02/26/19. DES of RCA x 1. No other obstructive disease. Normal LV function. Continue ASA and statin therapy  2. HTN- well controlled. Continue amlodipine, lisinopril, Coreg.   3. Hyperlipidemia- on high dose statin and Zetia. LDL is at goal with LDL 40.   4. DM type 2. On metformin/Actos. Ast A1c 7.2%. encourage with his dietary changes and weight loss.   Current medicines are reviewed at length with the patient today.  The patient does not have concerns regarding medicines.  The following changes have been made:  See above  Labs/ tests ordered today include:   Orders Placed This Encounter  Procedures   EKG 12-Lead     Disposition:   FU with me in 12 months  Signed, Camren Lipsett Martinique, MD  08/31/2021 9:09 AM    [facility] [address], [place], [place], [address] Phone [phone], Fax [phone]

## 2021-08-31 ENCOUNTER — Ambulatory Visit (INDEPENDENT_AMBULATORY_CARE_PROVIDER_SITE_OTHER): Payer: 59 | Admitting: Cardiology

## 2021-08-31 ENCOUNTER — Other Ambulatory Visit: Payer: Self-pay

## 2021-08-31 ENCOUNTER — Other Ambulatory Visit (HOSPITAL_COMMUNITY): Payer: Self-pay

## 2021-08-31 ENCOUNTER — Encounter: Payer: Self-pay | Admitting: Cardiology

## 2021-08-31 VITALS — BP 126/74 | HR 51 | Ht 70.0 in | Wt 226.2 lb

## 2021-08-31 DIAGNOSIS — I2510 Atherosclerotic heart disease of native coronary artery without angina pectoris: Secondary | ICD-10-CM

## 2021-08-31 DIAGNOSIS — I10 Essential (primary) hypertension: Secondary | ICD-10-CM | POA: Diagnosis not present

## 2021-08-31 DIAGNOSIS — E7800 Pure hypercholesterolemia, unspecified: Secondary | ICD-10-CM | POA: Diagnosis not present

## 2021-08-31 DIAGNOSIS — E118 Type 2 diabetes mellitus with unspecified complications: Secondary | ICD-10-CM | POA: Diagnosis not present

## 2021-08-31 MED ORDER — NITROGLYCERIN 0.4 MG SL SUBL
0.4000 mg | SUBLINGUAL_TABLET | SUBLINGUAL | 2 refills | Status: AC | PRN
  Filled 2021-08-31: qty 25, 10d supply, fill #0

## 2021-09-03 ENCOUNTER — Other Ambulatory Visit (HOSPITAL_COMMUNITY): Payer: Self-pay

## 2021-09-03 MED ORDER — DEXCOM G6 SENSOR MISC
2 refills | Status: DC
  Filled 2021-09-03: qty 3, 30d supply, fill #0

## 2021-09-04 ENCOUNTER — Other Ambulatory Visit (HOSPITAL_COMMUNITY): Payer: Self-pay

## 2021-10-02 ENCOUNTER — Other Ambulatory Visit (HOSPITAL_COMMUNITY): Payer: Self-pay

## 2021-10-02 ENCOUNTER — Other Ambulatory Visit: Payer: Self-pay | Admitting: Student

## 2021-10-02 DIAGNOSIS — E1159 Type 2 diabetes mellitus with other circulatory complications: Secondary | ICD-10-CM | POA: Diagnosis not present

## 2021-10-02 DIAGNOSIS — E785 Hyperlipidemia, unspecified: Secondary | ICD-10-CM

## 2021-10-02 MED ORDER — CARVEDILOL 6.25 MG PO TABS
6.2500 mg | ORAL_TABLET | Freq: Two times a day (BID) | ORAL | 1 refills | Status: DC
  Filled 2021-10-02: qty 180, 90d supply, fill #0
  Filled 2022-01-04: qty 180, 90d supply, fill #1

## 2021-10-02 MED ORDER — ROSUVASTATIN CALCIUM 40 MG PO TABS
40.0000 mg | ORAL_TABLET | Freq: Every day | ORAL | 1 refills | Status: DC
  Filled 2021-10-02: qty 90, 90d supply, fill #0
  Filled 2022-01-04: qty 90, 90d supply, fill #1

## 2021-10-02 NOTE — Telephone Encounter (Signed)
This is Dr. Jordan's pt. °

## 2021-11-06 ENCOUNTER — Other Ambulatory Visit (HOSPITAL_COMMUNITY): Payer: Self-pay

## 2021-11-06 MED ORDER — TADALAFIL 20 MG PO TABS
10.0000 mg | ORAL_TABLET | Freq: Every day | ORAL | 1 refills | Status: DC | PRN
  Filled 2021-11-06: qty 18, 18d supply, fill #0

## 2021-11-06 MED ORDER — PIOGLITAZONE HCL-METFORMIN HCL 15-500 MG PO TABS
1.0000 | ORAL_TABLET | Freq: Two times a day (BID) | ORAL | 3 refills | Status: DC
  Filled 2021-11-06: qty 60, 30d supply, fill #0
  Filled 2021-12-02: qty 60, 30d supply, fill #1

## 2021-12-02 ENCOUNTER — Other Ambulatory Visit (HOSPITAL_COMMUNITY): Payer: Self-pay

## 2021-12-02 ENCOUNTER — Other Ambulatory Visit: Payer: Self-pay | Admitting: Student

## 2021-12-02 DIAGNOSIS — Z0289 Encounter for other administrative examinations: Secondary | ICD-10-CM

## 2021-12-02 MED ORDER — EZETIMIBE 10 MG PO TABS
10.0000 mg | ORAL_TABLET | Freq: Every day | ORAL | 2 refills | Status: DC
  Filled 2021-12-02: qty 90, 90d supply, fill #0
  Filled 2022-03-08: qty 90, 90d supply, fill #1

## 2021-12-09 ENCOUNTER — Other Ambulatory Visit (HOSPITAL_COMMUNITY): Payer: Self-pay

## 2021-12-29 ENCOUNTER — Other Ambulatory Visit: Payer: Self-pay | Admitting: Cardiology

## 2021-12-29 ENCOUNTER — Encounter (INDEPENDENT_AMBULATORY_CARE_PROVIDER_SITE_OTHER): Payer: Self-pay | Admitting: Family Medicine

## 2021-12-29 ENCOUNTER — Ambulatory Visit (INDEPENDENT_AMBULATORY_CARE_PROVIDER_SITE_OTHER): Payer: 59 | Admitting: Family Medicine

## 2021-12-29 ENCOUNTER — Other Ambulatory Visit (HOSPITAL_COMMUNITY): Payer: Self-pay

## 2021-12-29 VITALS — BP 115/66 | HR 57 | Temp 97.8°F | Ht 71.0 in | Wt 227.0 lb

## 2021-12-29 DIAGNOSIS — E669 Obesity, unspecified: Secondary | ICD-10-CM

## 2021-12-29 DIAGNOSIS — E1169 Type 2 diabetes mellitus with other specified complication: Secondary | ICD-10-CM

## 2021-12-29 DIAGNOSIS — E785 Hyperlipidemia, unspecified: Secondary | ICD-10-CM

## 2021-12-29 DIAGNOSIS — R5383 Other fatigue: Secondary | ICD-10-CM

## 2021-12-29 DIAGNOSIS — Z87891 Personal history of nicotine dependence: Secondary | ICD-10-CM

## 2021-12-29 DIAGNOSIS — E1159 Type 2 diabetes mellitus with other circulatory complications: Secondary | ICD-10-CM | POA: Diagnosis not present

## 2021-12-29 DIAGNOSIS — Z1331 Encounter for screening for depression: Secondary | ICD-10-CM | POA: Diagnosis not present

## 2021-12-29 DIAGNOSIS — R0602 Shortness of breath: Secondary | ICD-10-CM

## 2021-12-29 DIAGNOSIS — I2510 Atherosclerotic heart disease of native coronary artery without angina pectoris: Secondary | ICD-10-CM

## 2021-12-29 DIAGNOSIS — E118 Type 2 diabetes mellitus with unspecified complications: Secondary | ICD-10-CM

## 2021-12-29 DIAGNOSIS — Z6831 Body mass index (BMI) 31.0-31.9, adult: Secondary | ICD-10-CM

## 2021-12-29 DIAGNOSIS — I152 Hypertension secondary to endocrine disorders: Secondary | ICD-10-CM

## 2021-12-29 MED ORDER — OZEMPIC (0.25 OR 0.5 MG/DOSE) 2 MG/3ML ~~LOC~~ SOPN
0.2500 mg | PEN_INJECTOR | SUBCUTANEOUS | 0 refills | Status: DC
  Filled 2021-12-29: qty 3, 28d supply, fill #0

## 2021-12-29 MED ORDER — BD PEN NEEDLE NANO 2ND GEN 32G X 4 MM MISC
1.0000 | Freq: Two times a day (BID) | 0 refills | Status: DC
  Filled 2021-12-29: qty 100, 50d supply, fill #0

## 2021-12-30 ENCOUNTER — Other Ambulatory Visit (HOSPITAL_COMMUNITY): Payer: Self-pay

## 2021-12-30 LAB — COMPREHENSIVE METABOLIC PANEL
ALT: 26 IU/L (ref 0–44)
AST: 19 IU/L (ref 0–40)
Albumin/Globulin Ratio: 1.8 (ref 1.2–2.2)
Albumin: 4.7 g/dL (ref 3.8–4.9)
Alkaline Phosphatase: 62 IU/L (ref 44–121)
BUN/Creatinine Ratio: 21 — ABNORMAL HIGH (ref 9–20)
BUN: 17 mg/dL (ref 6–24)
Bilirubin Total: 0.2 mg/dL (ref 0.0–1.2)
CO2: 24 mmol/L (ref 20–29)
Calcium: 10 mg/dL (ref 8.7–10.2)
Chloride: 101 mmol/L (ref 96–106)
Creatinine, Ser: 0.82 mg/dL (ref 0.76–1.27)
Globulin, Total: 2.6 g/dL (ref 1.5–4.5)
Glucose: 107 mg/dL — ABNORMAL HIGH (ref 70–99)
Potassium: 4.7 mmol/L (ref 3.5–5.2)
Sodium: 140 mmol/L (ref 134–144)
Total Protein: 7.3 g/dL (ref 6.0–8.5)
eGFR: 102 mL/min/{1.73_m2} (ref >59)

## 2021-12-30 LAB — CBC WITH DIFFERENTIAL/PLATELET
Basophils Absolute: 0.1 10*3/uL (ref 0.0–0.2)
Basos: 1 %
EOS (ABSOLUTE): 0.3 10*3/uL (ref 0.0–0.4)
Eos: 4 %
Hematocrit: 45.9 % (ref 37.5–51.0)
Hemoglobin: 14.7 g/dL (ref 13.0–17.7)
Immature Grans (Abs): 0 10*3/uL (ref 0.0–0.1)
Immature Granulocytes: 0 %
Lymphocytes Absolute: 2.2 10*3/uL (ref 0.7–3.1)
Lymphs: 31 %
MCH: 26.7 pg (ref 26.6–33.0)
MCHC: 32 g/dL (ref 31.5–35.7)
MCV: 83 fL (ref 79–97)
Monocytes Absolute: 0.7 10*3/uL (ref 0.1–0.9)
Monocytes: 10 %
Neutrophils Absolute: 3.8 10*3/uL (ref 1.4–7.0)
Neutrophils: 54 %
Platelets: 143 10*3/uL — ABNORMAL LOW (ref 150–450)
RBC: 5.51 x10E6/uL (ref 4.14–5.80)
RDW: 14 % (ref 11.6–15.4)
WBC: 6.9 10*3/uL (ref 3.4–10.8)

## 2021-12-30 LAB — LIPID PANEL WITH LDL/HDL RATIO
Cholesterol, Total: 116 mg/dL (ref 100–199)
HDL: 48 mg/dL (ref >39)
LDL Chol Calc (NIH): 52 mg/dL (ref 0–99)
LDL/HDL Ratio: 1.1 ratio (ref 0.0–3.6)
Triglycerides: 79 mg/dL (ref 0–149)
VLDL Cholesterol Cal: 16 mg/dL (ref 5–40)

## 2021-12-30 LAB — FOLATE: Folate: 4.9 ng/mL (ref >3.0)

## 2021-12-30 LAB — T3: T3, Total: 105 ng/dL (ref 71–180)

## 2021-12-30 LAB — VITAMIN D 25 HYDROXY (VIT D DEFICIENCY, FRACTURES): Vit D, 25-Hydroxy: 40 ng/mL (ref 30.0–100.0)

## 2021-12-30 LAB — VITAMIN B12: Vitamin B-12: 419 pg/mL (ref 232–1245)

## 2021-12-30 LAB — T4, FREE: Free T4: 1.06 ng/dL (ref 0.82–1.77)

## 2021-12-30 LAB — INSULIN, RANDOM: INSULIN: 22.7 u[IU]/mL (ref 2.6–24.9)

## 2021-12-30 LAB — TSH: TSH: 1.42 u[IU]/mL (ref 0.450–4.500)

## 2021-12-30 MED ORDER — ASPIRIN 81 MG PO CHEW
81.0000 mg | CHEWABLE_TABLET | Freq: Every day | ORAL | 3 refills | Status: AC
  Filled 2021-12-30: qty 90, 90d supply, fill #0
  Filled 2022-04-06: qty 90, 90d supply, fill #1
  Filled 2022-09-29: qty 90, 90d supply, fill #2

## 2021-12-30 NOTE — Progress Notes (Signed)
Chief Complaint:   OBESITY [name] (MR# [id number]) is a 61 y.o. male who presents for evaluation and treatment of obesity and related comorbidities. Current BMI is Body mass index is 31.66 kg/m. Timothy May has been struggling with his weight for many years and has been unsuccessful in either losing weight, maintaining weight loss, or reaching his healthy weight goal.  [name] was referred by Dr. [place].  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  Breakfast daily (due to hunger).  Coffee with 1 teaspoon of cream and 1 tablespoon of sugar.  Lunch, leftovers from night before or [employer] "a pound or meal with fries and a drink (satisfied).  6-8 PM dinner, meat, 6 to 7 ounces, salad, 1 to 2 cups of vegetables (full if he eats seconds).  After dinner snack, fruit or something sweet.  [name] is currently in the action stage of change and ready to dedicate time achieving and maintaining a healthier weight. [name] is interested in becoming our patient and working on intensive lifestyle modifications including (but not limited to) diet and exercise for weight loss.  [name]'s habits were reviewed today and are as follows: His family eats meals together, he thinks his family will eat healthier with him, his desired weight loss is 37 lbs, he has been heavy most of his life, his heaviest weight ever was 247 pounds, he has significant food cravings issues, he snacks frequently in the evenings, he skips meals frequently, he is frequently drinking liquids with calories, he frequently makes poor food choices, he has problems with excessive hunger, and he frequently eats larger portions than normal.  Depression Screen Dashun's Food and Mood (modified PHQ-9) score was 4.     12/29/2021    9:37 AM  Depression screen PHQ 2/9  Decreased Interest 0  Down, Depressed, Hopeless 0  PHQ - 2 Score 0  Altered sleeping 1  Tired, decreased energy 2  Change in appetite 0  Feeling bad or  failure about yourself  0  Trouble concentrating 1  Moving slowly or fidgety/restless 0  Suicidal thoughts 0  PHQ-9 Score 4  Difficult doing work/chores Not difficult at all   Subjective:   1. Other fatigue Timothy May admits to daytime somnolence and denies waking up still tired. Patient has a history of symptoms of daytime fatigue. Timothy May generally gets 6 or 7 hours of sleep per night, and states that he has generally restful sleep. Snoring is present. Apneic episodes are not present. Epworth Sleepiness Score is 5.  EKG-from March 2023.  2. SOBOE (shortness of breath on exertion) Timothy May notes increasing shortness of breath with exercising and seems to be worsening over time with weight gain. He notes getting out of breath sooner with activity than he used to. This has not gotten worse recently. Timothy May denies shortness of breath at rest or orthopnea.  3. Type 2 diabetes mellitus with complication, without long-term current use of insulin (HCC) Timothy May's last A1c was 6.9.  Last eye exam was in December 2022.  Last foot exam was 2 years ago.  4. Hyperlipidemia associated with type 2 diabetes mellitus (HCC) [name] is on rosuvastatin and ezetimibe.  Last FLP was within normal limits.  5. Coronary artery disease involving native coronary artery of native heart without angina pectoris Timothy May has a history of STEMI in 2020.  He sees cardiology, now going to be yearly.  He is on beta-blocker, CCB, and ACE.  6. Hypertension associated with diabetes (HCC) Daris was  diagnosed many years ago.  His blood pressure is well managed today.  Assessment/Plan:   1. Other fatigue Timothy May does feel that his weight is causing his energy to be lower than it should be. Fatigue may be related to obesity, depression or many other causes. Labs will be ordered, and in the meanwhile, [name] will focus on self care including making healthy food choices, increasing physical activity and focusing on stress reduction.  - Vitamin  B12 - CBC with Differential/Platelet - Folate - T3 - T4, free - TSH - VITAMIN D 25 Hydroxy (Vit-D Deficiency, Fractures)  2. SOBOE (shortness of breath on exertion) [name] does feel that he gets out of breath more easily that he used to when he exercises. Timothy May's shortness of breath appears to be obesity related and exercise induced. He has agreed to work on weight loss and gradually increase exercise to treat his exercise induced shortness of breath. Will continue to monitor closely.  3. Type 2 diabetes mellitus with complication, without long-term current use of insulin (HCC) We will check labs today.  Timothy May will stop Actos, and start Ozempic 0.25 mg subcu weekly with no refills; pen needles #100 with no refills.  - Insulin, random - Semaglutide,0.25 or 0.5MG /DOS, (OZEMPIC, 0.25 OR 0.5 MG/DOSE,) 2 MG/3ML SOPN; Inject 0.25 mg into the skin once a week.  Dispense: 3 mL; Refill: 0 - Insulin Pen Needle (BD PEN NEEDLE NANO 2ND GEN) 32G X 4 MM MISC; Use 1 pen needle 2 times daily  Dispense: 100 each; Refill: 0  4. Hyperlipidemia associated with type 2 diabetes mellitus (HCC) We will check labs today, and we will follow-up at [name]'s next appointment.  - Lipid Panel With LDL/HDL Ratio  5. Coronary artery disease involving native coronary artery of native heart without angina pectoris Timothy May will continue to follow-up with cardiology in 1 year.  6. Hypertension associated with diabetes (HCC) We will check labs today, and we will follow-up at Timothy May's next appointment.  - Comprehensive metabolic panel  7. Depression screening Erol had a positive depression screening. Depression is commonly associated with obesity and often results in emotional eating behaviors. We will monitor this closely and work on CBT to help improve the non-hunger eating patterns. Referral to Psychology may be required if no improvement is seen as he continues in our clinic.  8. Class 1 obesity with serious comorbidity  and body mass index (BMI) of 31.0 to 31.9 in adult, unspecified obesity type Timothy May is currently in the action stage of change and his goal is to continue with weight loss efforts. I recommend Dann begin the structured treatment plan as follows:  He has agreed to the Category 4 Plan.  Exercise goals: No exercise has been prescribed at this time.   Behavioral modification strategies: increasing lean protein intake, meal planning and cooking strategies, keeping healthy foods in the home, and planning for success.  He was informed of the importance of frequent follow-up visits to maximize his success with intensive lifestyle modifications for his multiple health conditions. He was informed we would discuss his lab results at his next visit unless there is a critical issue that needs to be addressed sooner. Hanley agreed to keep his next visit at the agreed upon time to discuss these results.  Objective:   Blood pressure 115/66, pulse (!) 57, temperature 97.8 F (36.6 C), height 5\' 11"  (1.803 m), weight 227 lb (103 kg), SpO2 98 %. Body mass index is 31.66 kg/m.  EKG: Normal sinus rhythm, rate  51 BPM.  Indirect Calorimeter completed today shows a VO2 of 314 and a REE of 2174.  His calculated basal metabolic rate is 3419 thus his basal metabolic rate is better than expected.  General: Cooperative, alert, well developed, in no acute distress. HEENT: Conjunctivae and lids unremarkable. Cardiovascular: Regular rhythm.  Lungs: Normal work of breathing. Neurologic: No focal deficits.   Lab Results  Component Value Date   CREATININE 0.82 12/29/2021   BUN 17 12/29/2021   NA 140 12/29/2021   K 4.7 12/29/2021   CL 101 12/29/2021   CO2 24 12/29/2021   Lab Results  Component Value Date   ALT 26 12/29/2021   AST 19 12/29/2021   ALKPHOS 62 12/29/2021   BILITOT 0.2 12/29/2021   Lab Results  Component Value Date   HGBA1C 7.2 (H) 06/04/2021   HGBA1C 7.1 (H) 02/18/2020   HGBA1C 7.0 (H)  02/26/2019   Lab Results  Component Value Date   INSULIN 22.7 12/29/2021   Lab Results  Component Value Date   TSH 1.420 12/29/2021   Lab Results  Component Value Date   CHOL 116 12/29/2021   HDL 48 12/29/2021   LDLCALC 52 12/29/2021   LDLDIRECT 61 07/27/2019   TRIG 79 12/29/2021   CHOLHDL 2.6 06/04/2021   Lab Results  Component Value Date   WBC 6.9 12/29/2021   HGB 14.7 12/29/2021   HCT 45.9 12/29/2021   MCV 83 12/29/2021   PLT 143 (L) 12/29/2021   No results found for: "IRON", "TIBC", "FERRITIN"  Attestation Statements:   Reviewed by clinician on day of visit: allergies, medications, problem list, medical history, surgical history, family history, social history, and previous encounter notes.   I, Burt Knack, am acting as transcriptionist for Reuben Likes, MD. This is the patient's first visit at [facility]. The patient's NEW PATIENT PACKET was reviewed at length. Included in the packet: current and past health history, medications, allergies, ROS, gynecologic history (women only), surgical history, family history, social history, weight history, weight loss surgery history (for those that have had weight loss surgery), nutritional evaluation, mood and food questionnaire, PHQ9, Epworth questionnaire, sleep habits questionnaire, patient life and health improvement goals questionnaire. These will all be scanned into the patient's chart under media.   During the visit, I independently reviewed the patient's EKG, bioimpedance scale results, and indirect calorimeter results. I used this information to tailor a meal plan for the patient that will help him to lose weight and will improve his obesity-related conditions going forward. I performed a medically necessary appropriate examination and/or evaluation. I discussed the assessment and treatment plan with the patient. The patient was provided an opportunity to ask questions and all were answered. The  patient agreed with the plan and demonstrated an understanding of the instructions. Labs were ordered at this visit and will be reviewed at the next visit unless more critical results need to be addressed immediately. Clinical information was updated and documented in the EMR.    I have reviewed the above documentation for accuracy and completeness, and I agree with the above. - Reuben Likes, MD

## 2022-01-04 ENCOUNTER — Other Ambulatory Visit (HOSPITAL_COMMUNITY): Payer: Self-pay

## 2022-01-12 ENCOUNTER — Other Ambulatory Visit (HOSPITAL_COMMUNITY): Payer: Self-pay

## 2022-01-12 ENCOUNTER — Ambulatory Visit (INDEPENDENT_AMBULATORY_CARE_PROVIDER_SITE_OTHER): Payer: 59 | Admitting: Family Medicine

## 2022-01-12 ENCOUNTER — Encounter (INDEPENDENT_AMBULATORY_CARE_PROVIDER_SITE_OTHER): Payer: Self-pay | Admitting: Family Medicine

## 2022-01-12 VITALS — BP 126/72 | HR 52 | Temp 97.7°F | Ht 71.0 in | Wt 218.0 lb

## 2022-01-12 DIAGNOSIS — E1169 Type 2 diabetes mellitus with other specified complication: Secondary | ICD-10-CM | POA: Diagnosis not present

## 2022-01-12 DIAGNOSIS — E559 Vitamin D deficiency, unspecified: Secondary | ICD-10-CM

## 2022-01-12 DIAGNOSIS — E785 Hyperlipidemia, unspecified: Secondary | ICD-10-CM

## 2022-01-12 DIAGNOSIS — E669 Obesity, unspecified: Secondary | ICD-10-CM | POA: Diagnosis not present

## 2022-01-12 DIAGNOSIS — Z7985 Long-term (current) use of injectable non-insulin antidiabetic drugs: Secondary | ICD-10-CM | POA: Diagnosis not present

## 2022-01-12 DIAGNOSIS — Z6830 Body mass index (BMI) 30.0-30.9, adult: Secondary | ICD-10-CM

## 2022-01-12 DIAGNOSIS — E118 Type 2 diabetes mellitus with unspecified complications: Secondary | ICD-10-CM

## 2022-01-12 MED ORDER — DEXCOM G6 SENSOR MISC
2 refills | Status: AC
  Filled 2022-01-12: qty 3, 30d supply, fill #0

## 2022-01-14 ENCOUNTER — Other Ambulatory Visit (HOSPITAL_COMMUNITY): Payer: Self-pay

## 2022-01-14 ENCOUNTER — Encounter (INDEPENDENT_AMBULATORY_CARE_PROVIDER_SITE_OTHER): Payer: Self-pay

## 2022-01-14 ENCOUNTER — Telehealth (INDEPENDENT_AMBULATORY_CARE_PROVIDER_SITE_OTHER): Payer: Self-pay | Admitting: Family Medicine

## 2022-01-14 NOTE — Progress Notes (Signed)
Chief Complaint:   OBESITY Timothy May is here to discuss his progress with his obesity treatment plan along with follow-up of his obesity related diagnoses. Timothy May is on the Category 4 Plan and states he is following his eating plan approximately 80-90% of the time. Timothy May states he is walking 8-10,000/08-3998, steps 5/2 times per week.  Today's visit was #: 2 Starting weight: 227 lbs Starting date: 12/29/2021 Today's weight: 218 lbs Today's date: 01/12/2022 Total lbs lost to date: 9 lbs Total lbs lost since last in-office visit: 9  Interim History: Timothy May is here for 1st follow up. Not really hungry, as he is not able to get all food in. He is not a big fan of eggs. He misses fruit, particularly watermelon. He was able to get all food at lunch and dinner in. Snack calories are yogurt and/or popsicles. Has a big horse show in [facility] in August.  Subjective:   1. Type 2 diabetes mellitus with complication, without long-term current use of insulin (HCC) Timothy May is currently on Ozempic now blood sugars are 120-200's. He has a Dexcom but struggles to get to pharmacy. Last A1c was 7.1.  2. Vitamin D deficiency Diego in not on Vit D. He notes fatigue. Vit D level of 40.0.  3. Hyperlipidemia associated with type 2 diabetes mellitus (HCC) Timothy May is currently taking Zetia and Crestor. FLP is within normal limits.  Assessment/Plan:   1. Type 2 diabetes mellitus with complication, without long-term current use of insulin (HCC) We will refill Dexcom Sensor for 1 month with 0 refills.  -Refill Continuous Blood Gluc Sensor (DEXCOM G6 SENSOR) MISC; Use as directed every 10 days  Dispense: 3 each; Refill: 2  2. Vitamin D deficiency Timothy May will take over the counter Vit D 5k IU daily.  3. Hyperlipidemia associated with type 2 diabetes mellitus (HCC) Continue with current medications with no refills needed today. At goal.  4. Obesity with current BMI of 30.4 Timothy May is currently in the action stage of  change. As such, his goal is to continue with weight loss efforts. He has agreed to the Category 4 Plan.   Exercise goals: All adults should avoid inactivity. Some physical activity is better than none, and adults who participate in any amount of physical activity gain some health benefits.  Behavioral modification strategies: increasing lean protein intake, meal planning and cooking strategies, keeping healthy foods in the home, and travel eating strategies.  Timothy May has agreed to follow-up with our clinic in 2 weeks. He was informed of the importance of frequent follow-up visits to maximize his success with intensive lifestyle modifications for his multiple health conditions.   Objective:   Blood pressure 126/72, pulse (!) 52, temperature 97.7 F (36.5 C), height 5\' 11"  (1.803 m), weight 218 lb (98.9 kg), SpO2 97 %. Body mass index is 30.4 kg/m.  General: Cooperative, alert, well developed, in no acute distress. HEENT: Conjunctivae and lids unremarkable. Cardiovascular: Regular rhythm.  Lungs: Normal work of breathing. Neurologic: No focal deficits.   Lab Results  Component Value Date   CREATININE 0.82 12/29/2021   BUN 17 12/29/2021   NA 140 12/29/2021   K 4.7 12/29/2021   CL 101 12/29/2021   CO2 24 12/29/2021   Lab Results  Component Value Date   ALT 26 12/29/2021   AST 19 12/29/2021   ALKPHOS 62 12/29/2021   BILITOT 0.2 12/29/2021   Lab Results  Component Value Date   HGBA1C 7.2 (H) 06/04/2021   HGBA1C 7.1 (  H) 02/18/2020   HGBA1C 7.0 (H) 02/26/2019   Lab Results  Component Value Date   INSULIN 22.7 12/29/2021   Lab Results  Component Value Date   TSH 1.420 12/29/2021   Lab Results  Component Value Date   CHOL 116 12/29/2021   HDL 48 12/29/2021   LDLCALC 52 12/29/2021   LDLDIRECT 61 07/27/2019   TRIG 79 12/29/2021   CHOLHDL 2.6 06/04/2021   Lab Results  Component Value Date   VD25OH 40.0 12/29/2021   Lab Results  Component Value Date   WBC 6.9  12/29/2021   HGB 14.7 12/29/2021   HCT 45.9 12/29/2021   MCV 83 12/29/2021   PLT 143 (L) 12/29/2021   No results found for: "IRON", "TIBC", "FERRITIN"  Attestation Statements:   Reviewed by clinician on day of visit: allergies, medications, problem list, medical history, surgical history, family history, social history, and previous encounter notes.  Time spent on visit including pre-visit chart review and post-visit care and charting was 45 minutes.   I, Fortino Sic, RMA am acting as transcriptionist for Reuben Likes, MD.  I have reviewed the above documentation for accuracy and completeness, and I agree with the above. - Reuben Likes, MD

## 2022-01-14 NOTE — Telephone Encounter (Signed)
Dr. Lawson Radar - Prior authorization approved for Dexcom G6 Sensor. Effective: 01/14/2022 to 01/14/2023. Patient sent approval message via mychart.

## 2022-01-21 ENCOUNTER — Other Ambulatory Visit (HOSPITAL_COMMUNITY): Payer: Self-pay

## 2022-01-26 ENCOUNTER — Other Ambulatory Visit (HOSPITAL_COMMUNITY): Payer: Self-pay

## 2022-01-26 ENCOUNTER — Ambulatory Visit (INDEPENDENT_AMBULATORY_CARE_PROVIDER_SITE_OTHER): Payer: 59 | Admitting: Adult Health

## 2022-01-26 ENCOUNTER — Encounter (INDEPENDENT_AMBULATORY_CARE_PROVIDER_SITE_OTHER): Payer: Self-pay | Admitting: Adult Health

## 2022-01-26 VITALS — BP 129/64 | HR 52 | Temp 98.1°F | Ht 71.0 in | Wt 213.0 lb

## 2022-01-26 DIAGNOSIS — E1159 Type 2 diabetes mellitus with other circulatory complications: Secondary | ICD-10-CM

## 2022-01-26 DIAGNOSIS — Z6829 Body mass index (BMI) 29.0-29.9, adult: Secondary | ICD-10-CM

## 2022-01-26 DIAGNOSIS — Z7985 Long-term (current) use of injectable non-insulin antidiabetic drugs: Secondary | ICD-10-CM | POA: Diagnosis not present

## 2022-01-26 DIAGNOSIS — E118 Type 2 diabetes mellitus with unspecified complications: Secondary | ICD-10-CM

## 2022-01-26 DIAGNOSIS — E669 Obesity, unspecified: Secondary | ICD-10-CM | POA: Diagnosis not present

## 2022-01-26 DIAGNOSIS — I152 Hypertension secondary to endocrine disorders: Secondary | ICD-10-CM | POA: Diagnosis not present

## 2022-01-26 DIAGNOSIS — Z9189 Other specified personal risk factors, not elsewhere classified: Secondary | ICD-10-CM

## 2022-01-26 MED ORDER — SEMAGLUTIDE(0.25 OR 0.5MG/DOS) 2 MG/3ML ~~LOC~~ SOPN
0.5000 mg | PEN_INJECTOR | SUBCUTANEOUS | 0 refills | Status: DC
  Filled 2022-01-26 – 2022-02-05 (×4): qty 3, 28d supply, fill #0

## 2022-01-27 ENCOUNTER — Encounter (INDEPENDENT_AMBULATORY_CARE_PROVIDER_SITE_OTHER): Payer: Self-pay

## 2022-01-28 NOTE — Progress Notes (Signed)
Chief Complaint:   OBESITY Timothy May is here to discuss his progress with his obesity treatment plan along with follow-up of his obesity related diagnoses. Timothy May is on the Category 4 Plan and states he is following his eating plan approximately 90% of the time. Timothy May states he is walking.   Today's visit was #: 3 Starting weight: 227 lbs Starting date: 12/29/2021 Today's weight: 213 lbs Today's date: 01/26/2022 Total lbs lost to date: 14 lbs Total lbs lost since last in-office visit: 5  Interim History:  On 12/29/21, Actos was replaced with Ozempic 0.25 mg--4 doses, then increase Ozempic to 0.5 mg--1 dose at increased strength. Denzel endorses "feeling much better the last 2 weeks.  Subjective:   1. Type 2 diabetes mellitus with complication, without long-term current use of insulin (HCC) On 12/29/21, Actos was replaced with Ozempic 0.25 mg--4 doses, then increase Ozempic to 0.5 mg--1 dose at increased strength.  Timothy May's fasting blood sugars are 120-130. He denies GLP-1 medication SE at present.  2. Hypertension associated with diabetes (HCC) Mehdi's home readings SBPs 110-120, DBP 60s.  He has history of MI in 2019--treated with PCI and 1 stent. Current medication regime:   lisinopril (ZESTRIL) 40 MG tablet    Take 1 tablet (40 mg total) by mouth daily    amLODipine (NORVASC) 10 MG tablet    Take 1 tablet (10 mg total) by mouth daily    nitroGLYCERIN (NITROSTAT) 0.4 MG SL tablet    Place 1 tablet (0.4 mg total) under the tongue every 5 (five) minutes x 3 doses as needed for chest pain    rosuvastatin (CRESTOR) 40 MG tablet    Take 1 tablet (40 mg total) by mouth daily    carvedilol (COREG) 6.25 MG tablet    Take 1 tablet (6.25 mg total) by mouth 2 (two) times daily    tadalafil (CIALIS) 20 MG tablet    Take 0.5-1 tablets (10-20 mg total) by mouth daily as needed    ezetimibe (ZETIA) 10 MG tablet    Take 1 tablet (10 mg total) by mouth daily   aspirin (ASPIRIN LOW DOSE)  81 MG chewable tablet    Chew 1 tablet (81 mg total) by mouth daily   Semaglutide,0.25 or 0.5MG /DOS, 2 MG/3ML SOPN    Inject 0.5 mg into the skin once a week    3. At risk for constipation Timothy May is at increased risk for constipation due to inadequate water intake, changes in diet, and/or use of medications such as GLP1 agonists. Timothy May denies hard, infrequent stools currently.   Assessment/Plan:   1. Type 2 diabetes mellitus with complication, without long-term current use of insulin (HCC) We will refill Ozempic to 0.5 mg SubQ once weekly for 1 month with 0 refills.  -refill Semaglutide,0.25 or 0.5MG /DOS, 2 MG/3ML SOPN; Inject 0.5 mg into the skin once a week.  Dispense: 3 mL; Refill: 0  2. Hypertension associated with diabetes (HCC) We will continue to monitor blood pressure and for symptoms of hypotension.   3. At risk for constipation Heath was given approximately 15 minutes of counseling today regarding prevention of constipation. He was encouraged to increase water and fiber intake.   4. Obesity with current BMI of 29.8 Timothy May is currently in the action stage of change. As such, his goal is to continue with weight loss efforts. He has agreed to the Category 4 Plan.   Exercise goals: As is.  Behavioral modification strategies: increasing lean protein intake, decreasing simple  carbohydrates, meal planning and cooking strategies, keeping healthy foods in the home, and planning for success.  Timothy May has agreed to follow-up with our clinic in 3 weeks. He was informed of the importance of frequent follow-up visits to maximize his success with intensive lifestyle modifications for his multiple health conditions.   Objective:   Blood pressure 129/64, pulse (!) 52, temperature 98.1 F (36.7 C), height 5\' 11"  (1.803 m), weight 213 lb (96.6 kg), SpO2 98 %. Body mass index is 29.71 kg/m.  General: Cooperative, alert, well developed, in no acute distress. HEENT: Conjunctivae and lids  unremarkable. Cardiovascular: Regular rhythm.  Lungs: Normal work of breathing. Neurologic: No focal deficits.   Lab Results  Component Value Date   CREATININE 0.82 12/29/2021   BUN 17 12/29/2021   NA 140 12/29/2021   K 4.7 12/29/2021   CL 101 12/29/2021   CO2 24 12/29/2021   Lab Results  Component Value Date   ALT 26 12/29/2021   AST 19 12/29/2021   ALKPHOS 62 12/29/2021   BILITOT 0.2 12/29/2021   Lab Results  Component Value Date   HGBA1C 7.2 (H) 06/04/2021   HGBA1C 7.1 (H) 02/18/2020   HGBA1C 7.0 (H) 02/26/2019   Lab Results  Component Value Date   INSULIN 22.7 12/29/2021   Lab Results  Component Value Date   TSH 1.420 12/29/2021   Lab Results  Component Value Date   CHOL 116 12/29/2021   HDL 48 12/29/2021   LDLCALC 52 12/29/2021   LDLDIRECT 61 07/27/2019   TRIG 79 12/29/2021   CHOLHDL 2.6 06/04/2021   Lab Results  Component Value Date   VD25OH 40.0 12/29/2021   Lab Results  Component Value Date   WBC 6.9 12/29/2021   HGB 14.7 12/29/2021   HCT 45.9 12/29/2021   MCV 83 12/29/2021   PLT 143 (L) 12/29/2021   No results found for: "IRON", "TIBC", "FERRITIN"  Attestation Statements:   Reviewed by clinician on day of visit: allergies, medications, problem list, medical history, surgical history, family history, social history, and previous encounter notes.  I, Brendell Tyus, RMA, am acting as transcriptionist for 03/01/2022, NP.  I have reviewed the above documentation for accuracy and completeness, and I agree with the above. -  Rayna Brenner d. Navjot Loera, NP-C

## 2022-02-01 DIAGNOSIS — E669 Obesity, unspecified: Secondary | ICD-10-CM | POA: Insufficient documentation

## 2022-02-03 ENCOUNTER — Other Ambulatory Visit (HOSPITAL_COMMUNITY): Payer: Self-pay

## 2022-02-05 ENCOUNTER — Other Ambulatory Visit: Payer: Self-pay | Admitting: Cardiology

## 2022-02-05 ENCOUNTER — Other Ambulatory Visit (HOSPITAL_COMMUNITY): Payer: Self-pay

## 2022-02-05 MED ORDER — AMLODIPINE BESYLATE 10 MG PO TABS
ORAL_TABLET | ORAL | 0 refills | Status: DC
  Filled 2022-02-05: qty 90, 90d supply, fill #0

## 2022-02-05 MED ORDER — AMLODIPINE BESYLATE 10 MG PO TABS: 10.0000 mg | ORAL_TABLET | Freq: Every day | ORAL | 0 refills | Status: DC

## 2022-02-05 MED FILL — Amlodipine Besylate Tab 10 MG (Base Equivalent): ORAL | 90 days supply | Qty: 90 | Fill #0 | Status: AC

## 2022-02-08 ENCOUNTER — Other Ambulatory Visit (HOSPITAL_COMMUNITY): Payer: Self-pay

## 2022-02-12 ENCOUNTER — Other Ambulatory Visit (HOSPITAL_COMMUNITY): Payer: Self-pay

## 2022-02-16 ENCOUNTER — Encounter (INDEPENDENT_AMBULATORY_CARE_PROVIDER_SITE_OTHER): Payer: Self-pay | Admitting: Family Medicine

## 2022-02-16 ENCOUNTER — Ambulatory Visit (INDEPENDENT_AMBULATORY_CARE_PROVIDER_SITE_OTHER): Payer: 59 | Admitting: Family Medicine

## 2022-02-16 VITALS — BP 121/65 | HR 74 | Temp 97.5°F | Ht 71.0 in | Wt 211.0 lb

## 2022-02-16 DIAGNOSIS — Z6829 Body mass index (BMI) 29.0-29.9, adult: Secondary | ICD-10-CM | POA: Diagnosis not present

## 2022-02-16 DIAGNOSIS — Z7985 Long-term (current) use of injectable non-insulin antidiabetic drugs: Secondary | ICD-10-CM | POA: Diagnosis not present

## 2022-02-16 DIAGNOSIS — E669 Obesity, unspecified: Secondary | ICD-10-CM | POA: Diagnosis not present

## 2022-02-16 DIAGNOSIS — E1159 Type 2 diabetes mellitus with other circulatory complications: Secondary | ICD-10-CM

## 2022-02-16 DIAGNOSIS — I152 Hypertension secondary to endocrine disorders: Secondary | ICD-10-CM | POA: Diagnosis not present

## 2022-02-16 DIAGNOSIS — E1165 Type 2 diabetes mellitus with hyperglycemia: Secondary | ICD-10-CM | POA: Diagnosis not present

## 2022-02-22 NOTE — Progress Notes (Signed)
Chief Complaint:   OBESITY Timothy May is here to discuss his progress with his obesity treatment plan along with follow-up of his obesity related diagnoses. Timothy May is on the Category 4 Plan and states he is following his eating plan approximately 60% of the time. Timothy May states he is walking 7 to 10,000 steps 5 times per week.  Today's visit was #: 4 Starting weight: 227 lbs Starting date: 12/29/2021 Today's weight: 211 lbs Today's date: 02/16/2022 Total lbs lost to date: 16 Total lbs lost since last in-office visit: 2  Interim History: Timothy May went away for a week and a grandbaby was visiting for a week, so he found it more challenging to adhere to the plan. Eating more nutritiously while away was difficult. Timothy May plans to stay home for Labor day weekend and he doesn't anticipate any obstacles adhering to the meal plan.  Subjective:   1. Type 2 diabetes mellitus with hyperglycemia, without long-term current use of insulin (HCC) Timothy May hasn't gotten his Dexcom yet. His fasting blood sugars have been high 80's to low 90's. He is now on Ozempic 0.5mg  and he is feeling much better.  2. Hypertension associated with diabetes (HCC) Timothy May's blood pressure is well controlled. He is on amlodipine and lisinopril. Timothy May denies chest pain, chest pressure, or headache.  Assessment/Plan:   1. Type 2 diabetes mellitus with hyperglycemia, without long-term current use of insulin (HCC) Timothy May agrees to continue taking Ozempic 0.5mg . We can consider increasing if he is able to get in all of his food.  2. Hypertension associated with diabetes (HCC) We will follow up Timothy May's blood pressure at the next appointment with no change in medication or dose.  3. Obesity with current BMI of 29.5 Timothy May is currently in the action stage of change. As such, his goal is to continue with weight loss efforts. He has agreed to the Category 4 Plan.   Exercise goals:  As is.  Behavioral modification strategies: increasing  lean protein intake, meal planning and cooking strategies, keeping healthy foods in the home, and planning for success.  Timothy May has agreed to follow-up with our clinic in 3 weeks. He was informed of the importance of frequent follow-up visits to maximize his success with intensive lifestyle modifications for his multiple health conditions.   Objective:   Blood pressure 121/65, pulse 74, temperature (!) 97.5 F (36.4 C), height 5\' 11"  (1.803 m), weight 211 lb (95.7 kg), SpO2 97 %. Body mass index is 29.43 kg/m.  General: Cooperative, alert, well developed, in no acute distress. HEENT: Conjunctivae and lids unremarkable. Cardiovascular: Regular rhythm.  Lungs: Normal work of breathing. Neurologic: No focal deficits.   Lab Results  Component Value Date   CREATININE 0.82 12/29/2021   BUN 17 12/29/2021   NA 140 12/29/2021   K 4.7 12/29/2021   CL 101 12/29/2021   CO2 24 12/29/2021   Lab Results  Component Value Date   ALT 26 12/29/2021   AST 19 12/29/2021   ALKPHOS 62 12/29/2021   BILITOT 0.2 12/29/2021   Lab Results  Component Value Date   HGBA1C 7.2 (H) 06/04/2021   HGBA1C 7.1 (H) 02/18/2020   HGBA1C 7.0 (H) 02/26/2019   Lab Results  Component Value Date   INSULIN 22.7 12/29/2021   Lab Results  Component Value Date   TSH 1.420 12/29/2021   Lab Results  Component Value Date   CHOL 116 12/29/2021   HDL 48 12/29/2021   LDLCALC 52 12/29/2021   LDLDIRECT 61 07/27/2019  TRIG 79 12/29/2021   CHOLHDL 2.6 06/04/2021   Lab Results  Component Value Date   VD25OH 40.0 12/29/2021   Lab Results  Component Value Date   WBC 6.9 12/29/2021   HGB 14.7 12/29/2021   HCT 45.9 12/29/2021   MCV 83 12/29/2021   PLT 143 (L) 12/29/2021   No results found for: "IRON", "TIBC", "FERRITIN"  Attestation Statements:   Reviewed by clinician on day of visit: allergies, medications, problem list, medical history, surgical history, family history, social history, and previous  encounter notes.  IKirke Corin, CMA, am acting as transcriptionist for Reuben Likes, MD  I have reviewed the above documentation for accuracy and completeness, and I agree with the above. - Reuben Likes, MD

## 2022-03-08 ENCOUNTER — Other Ambulatory Visit (HOSPITAL_COMMUNITY): Payer: Self-pay

## 2022-03-08 ENCOUNTER — Encounter (INDEPENDENT_AMBULATORY_CARE_PROVIDER_SITE_OTHER): Payer: Self-pay | Admitting: Adult Health

## 2022-03-08 ENCOUNTER — Encounter (INDEPENDENT_AMBULATORY_CARE_PROVIDER_SITE_OTHER): Payer: Self-pay | Admitting: Family Medicine

## 2022-03-08 ENCOUNTER — Other Ambulatory Visit (INDEPENDENT_AMBULATORY_CARE_PROVIDER_SITE_OTHER): Payer: Self-pay | Admitting: Adult Health

## 2022-03-08 DIAGNOSIS — E118 Type 2 diabetes mellitus with unspecified complications: Secondary | ICD-10-CM

## 2022-03-09 ENCOUNTER — Ambulatory Visit (INDEPENDENT_AMBULATORY_CARE_PROVIDER_SITE_OTHER): Payer: 59 | Admitting: Family Medicine

## 2022-03-09 ENCOUNTER — Other Ambulatory Visit (HOSPITAL_COMMUNITY): Payer: Self-pay

## 2022-03-09 ENCOUNTER — Encounter (INDEPENDENT_AMBULATORY_CARE_PROVIDER_SITE_OTHER): Payer: Self-pay | Admitting: Family Medicine

## 2022-03-09 VITALS — BP 124/66 | HR 66 | Temp 98.7°F | Ht 71.0 in | Wt 204.0 lb

## 2022-03-09 DIAGNOSIS — Z7985 Long-term (current) use of injectable non-insulin antidiabetic drugs: Secondary | ICD-10-CM | POA: Diagnosis not present

## 2022-03-09 DIAGNOSIS — I152 Hypertension secondary to endocrine disorders: Secondary | ICD-10-CM | POA: Diagnosis not present

## 2022-03-09 DIAGNOSIS — E1165 Type 2 diabetes mellitus with hyperglycemia: Secondary | ICD-10-CM | POA: Diagnosis not present

## 2022-03-09 DIAGNOSIS — E1159 Type 2 diabetes mellitus with other circulatory complications: Secondary | ICD-10-CM

## 2022-03-09 DIAGNOSIS — E669 Obesity, unspecified: Secondary | ICD-10-CM

## 2022-03-09 DIAGNOSIS — Z6828 Body mass index (BMI) 28.0-28.9, adult: Secondary | ICD-10-CM | POA: Diagnosis not present

## 2022-03-09 MED ORDER — SEMAGLUTIDE(0.25 OR 0.5MG/DOS) 2 MG/3ML ~~LOC~~ SOPN
0.5000 mg | PEN_INJECTOR | SUBCUTANEOUS | 0 refills | Status: DC
  Filled 2022-03-09: qty 3, 28d supply, fill #0

## 2022-03-11 NOTE — Progress Notes (Signed)
Chief Complaint:   OBESITY Timothy May is here to discuss his progress with his obesity treatment plan along with follow-up of his obesity related diagnoses. [name] is on the Category 4 Plan and states he is following his eating plan approximately 85% of the time. [name] states he is walking 7-10,000 steps 6 times per week.  Today's visit was #: 5 Starting weight: 227 lbs Starting date: 12/29/2021 Today's weight: 204 lbs Today's date: 03/09/22 Total lbs lost to date: 23 Total lbs lost since last in-office visit: -7  Interim History: Patient was out of town this week so he dropped into get seen.  He is doing well on the meal plan.  He is starting to get hungry, yesterday he was hungry all day.  He is hungry around 2 to 3 PM.  He is getting all of the 300 to 400 cal in for snacks.  Hunger is not consistent.  He is going to a horse show this week.  Subjective:   1. Type 2 diabetes mellitus with hyperglycemia, without long-term current use of insulin (HCC) On Ozempic 0.5 mg weekly. No GI side effects.  Some hunger in last few days.  2. Hypertension associated with diabetes ([place]) Blood pressure very well controlled. No chest pain, chest pressure, headache.  Assessment/Plan:   1. Type 2 diabetes mellitus with hyperglycemia, without long-term current use of insulin (HCC) Refill: - Semaglutide,0.25 or 0.5MG /DOS, 2 MG/3ML SOPN; Inject 0.5 mg into the skin once a week.  Dispense: 3 mL; Refill: 0  2. Hypertension associated with diabetes ([place]) Continue amlodipine.  No change in dose.  3. Obesity with current BMI of 28.5 [name] is currently in the action stage of change. As such, his goal is to continue with weight loss efforts. He has agreed to the Category 4 Plan.   Exercise goals: All adults should avoid inactivity. Some physical activity is better than none, and adults who participate in any amount of physical activity gain some health benefits.  Behavioral modification strategies:  increasing lean protein intake, meal planning and cooking strategies, keeping healthy foods in the home, and planning for success.  Ketih has agreed to follow-up with our clinic in 3-4 weeks. He was informed of the importance of frequent follow-up visits to maximize his success with intensive lifestyle modifications for his multiple health conditions.   Objective:   Blood pressure 124/66, pulse 66, temperature 98.7 F (37.1 C), height 5\' 11"  (1.803 m), weight 204 lb (92.5 kg), SpO2 98 %. Body mass index is 28.45 kg/m.  General: Cooperative, alert, well developed, in no acute distress. HEENT: Conjunctivae and lids unremarkable. Cardiovascular: Regular rhythm.  Lungs: Normal work of breathing. Neurologic: No focal deficits.   Lab Results  Component Value Date   CREATININE 0.82 12/29/2021   BUN 17 12/29/2021   NA 140 12/29/2021   K 4.7 12/29/2021   CL 101 12/29/2021   CO2 24 12/29/2021   Lab Results  Component Value Date   ALT 26 12/29/2021   AST 19 12/29/2021   ALKPHOS 62 12/29/2021   BILITOT 0.2 12/29/2021   Lab Results  Component Value Date   HGBA1C 7.2 (H) 06/04/2021   HGBA1C 7.1 (H) 02/18/2020   HGBA1C 7.0 (H) 02/26/2019   Lab Results  Component Value Date   INSULIN 22.7 12/29/2021   Lab Results  Component Value Date   TSH 1.420 12/29/2021   Lab Results  Component Value Date   CHOL 116 12/29/2021   HDL 48 12/29/2021  LDLCALC 52 12/29/2021   LDLDIRECT 61 07/27/2019   TRIG 79 12/29/2021   CHOLHDL 2.6 06/04/2021   Lab Results  Component Value Date   VD25OH 40.0 12/29/2021   Lab Results  Component Value Date   WBC 6.9 12/29/2021   HGB 14.7 12/29/2021   HCT 45.9 12/29/2021   MCV 83 12/29/2021   PLT 143 (L) 12/29/2021   No results found for: "IRON", "TIBC", "FERRITIN"  Attestation Statements:   Reviewed by clinician on day of visit: allergies, medications, problem list, medical history, surgical history, family history, social history, and previous  encounter notes.  I, Dawn Whitmire, FNP-C, am acting as Energy manager for Reuben Likes, MD.  I have reviewed the above documentation for accuracy and completeness, and I agree with the above. - Reuben Likes, MD

## 2022-03-16 ENCOUNTER — Ambulatory Visit (INDEPENDENT_AMBULATORY_CARE_PROVIDER_SITE_OTHER): Payer: 59 | Admitting: Family Medicine

## 2022-03-23 ENCOUNTER — Ambulatory Visit (INDEPENDENT_AMBULATORY_CARE_PROVIDER_SITE_OTHER): Payer: 59 | Admitting: Family Medicine

## 2022-04-06 ENCOUNTER — Other Ambulatory Visit: Payer: Self-pay | Admitting: Cardiology

## 2022-04-06 ENCOUNTER — Telehealth (INDEPENDENT_AMBULATORY_CARE_PROVIDER_SITE_OTHER): Payer: 59 | Admitting: Family Medicine

## 2022-04-06 ENCOUNTER — Other Ambulatory Visit (HOSPITAL_COMMUNITY): Payer: Self-pay

## 2022-04-06 ENCOUNTER — Encounter (INDEPENDENT_AMBULATORY_CARE_PROVIDER_SITE_OTHER): Payer: Self-pay | Admitting: Family Medicine

## 2022-04-06 VITALS — BP 142/76 | HR 70 | Temp 98.4°F | Ht 71.0 in | Wt 202.0 lb

## 2022-04-06 DIAGNOSIS — E1159 Type 2 diabetes mellitus with other circulatory complications: Secondary | ICD-10-CM | POA: Diagnosis not present

## 2022-04-06 DIAGNOSIS — E1165 Type 2 diabetes mellitus with hyperglycemia: Secondary | ICD-10-CM

## 2022-04-06 DIAGNOSIS — Z7985 Long-term (current) use of injectable non-insulin antidiabetic drugs: Secondary | ICD-10-CM

## 2022-04-06 DIAGNOSIS — E669 Obesity, unspecified: Secondary | ICD-10-CM | POA: Diagnosis not present

## 2022-04-06 DIAGNOSIS — Z794 Long term (current) use of insulin: Secondary | ICD-10-CM

## 2022-04-06 DIAGNOSIS — I152 Hypertension secondary to endocrine disorders: Secondary | ICD-10-CM

## 2022-04-06 DIAGNOSIS — E785 Hyperlipidemia, unspecified: Secondary | ICD-10-CM

## 2022-04-06 DIAGNOSIS — Z6828 Body mass index (BMI) 28.0-28.9, adult: Secondary | ICD-10-CM

## 2022-04-06 MED ORDER — BD PEN NEEDLE NANO 2ND GEN 32G X 4 MM MISC
1.0000 | Freq: Two times a day (BID) | 0 refills | Status: AC
  Filled 2022-04-06: qty 100, 50d supply, fill #0

## 2022-04-06 MED ORDER — CARVEDILOL 6.25 MG PO TABS
6.2500 mg | ORAL_TABLET | Freq: Two times a day (BID) | ORAL | 1 refills | Status: DC
  Filled 2022-04-06: qty 180, 90d supply, fill #0

## 2022-04-06 MED ORDER — SEMAGLUTIDE(0.25 OR 0.5MG/DOS) 2 MG/3ML ~~LOC~~ SOPN
0.5000 mg | PEN_INJECTOR | SUBCUTANEOUS | 0 refills | Status: DC
  Filled 2022-04-06: qty 9, 84d supply, fill #0

## 2022-04-06 MED ORDER — ROSUVASTATIN CALCIUM 40 MG PO TABS
40.0000 mg | ORAL_TABLET | Freq: Every day | ORAL | 1 refills | Status: DC
  Filled 2022-04-06: qty 90, 90d supply, fill #0

## 2022-04-08 ENCOUNTER — Other Ambulatory Visit (HOSPITAL_COMMUNITY): Payer: Self-pay

## 2022-04-08 DIAGNOSIS — I2510 Atherosclerotic heart disease of native coronary artery without angina pectoris: Secondary | ICD-10-CM | POA: Diagnosis not present

## 2022-04-08 DIAGNOSIS — E785 Hyperlipidemia, unspecified: Secondary | ICD-10-CM | POA: Diagnosis not present

## 2022-04-08 DIAGNOSIS — F419 Anxiety disorder, unspecified: Secondary | ICD-10-CM | POA: Diagnosis not present

## 2022-04-08 DIAGNOSIS — Z7984 Long term (current) use of oral hypoglycemic drugs: Secondary | ICD-10-CM | POA: Diagnosis not present

## 2022-04-08 DIAGNOSIS — I10 Essential (primary) hypertension: Secondary | ICD-10-CM | POA: Diagnosis not present

## 2022-04-08 DIAGNOSIS — E1159 Type 2 diabetes mellitus with other circulatory complications: Secondary | ICD-10-CM | POA: Diagnosis not present

## 2022-04-08 MED ORDER — CLONAZEPAM 1 MG PO TABS
0.5000 mg | ORAL_TABLET | Freq: Two times a day (BID) | ORAL | 5 refills | Status: DC | PRN
  Filled 2022-04-08: qty 60, 30d supply, fill #0

## 2022-04-08 MED ORDER — ROSUVASTATIN CALCIUM 40 MG PO TABS
40.0000 mg | ORAL_TABLET | Freq: Every day | ORAL | 1 refills | Status: AC
  Filled 2022-04-08: qty 90, 90d supply, fill #0

## 2022-04-08 MED ORDER — EZETIMIBE 10 MG PO TABS
10.0000 mg | ORAL_TABLET | Freq: Every day | ORAL | 1 refills | Status: DC
  Filled 2022-04-08 – 2022-06-28 (×2): qty 90, 90d supply, fill #0

## 2022-04-12 NOTE — Progress Notes (Signed)
Chief Complaint:   OBESITY Timothy May is here to discuss his progress with his obesity treatment plan along with follow-up of his obesity related diagnoses. Timothy May is on the Category 4 Plan and states he is following his eating plan approximately 70-75% of the time. Timothy May states he is walking 7-10,000 steps  5 times per week.  Today's visit was #: 6 Starting weight: 227 lbs Starting date: 12/29/2021 Today's weight: 202 lbs Today's date: 04/06/2022 Total lbs lost to date: 25 lbs Total lbs lost since last in-office visit: 2  Interim History: Timothy May has been out of town quite a bit over the last few weeks--[place], [place], birthday parties etc. Did recognize his protein intake was slightly less than when at home. Going to [employer] for Thanksgiving week. Wants to try to stick close to meal plan as much as he can.  Subjective:   1. Type 2 diabetes mellitus with hyperglycemia, without long-term current use of insulin (HCC) Travas is on Semaglutide 0.5 mg SubQ weekly. Denies GI side effects.  2. Hypertension associated with diabetes ([place]) Antwione's blood pressure slightly elevated today. Denies chest pain, chest pressure and headache.He is on Lisinopril, Coreg, Norvasc.  Assessment/Plan:   1. Type 2 diabetes mellitus with hyperglycemia, without long-term current use of insulin (HCC) We will refill Pen needles AND refill Ozempic 0.5 m.g SubQ once weekly for 1 month with 0 refills.  -Refill Insulin Pen Needle (BD PEN NEEDLE NANO 2ND GEN) 32G X 4 MM MISC; Use 1 pen needle 2 times daily  Dispense: 100 each; Refill: 0  -Refill Semaglutide,0.25 or 0.5MG /DOS, 2 MG/3ML SOPN; Inject 0.5 mg into the skin once a week.  Dispense: 9 mL; Refill: 0  2. Hypertension associated with diabetes ([place]) Follow up blood pressure next appointment without changes in medications or doses.  3. Obesity with current BMI of 28.2 Timothy May is currently in the action stage of change. As such, his goal is to continue with weight loss  efforts. He has agreed to the Category 4 Plan.   Exercise goals: All adults should avoid inactivity. Some physical activity is better than none, and adults who participate in any amount of physical activity gain some health benefits.  Behavioral modification strategies: increasing lean protein intake, meal planning and cooking strategies, and keeping healthy foods in the home.  Timothy May has agreed to follow-up with our clinic in 4 weeks. He was informed of the importance of frequent follow-up visits to maximize his success with intensive lifestyle modifications for his multiple health conditions.   Objective:   Blood pressure (!) 142/76, pulse 70, temperature 98.4 F (36.9 C), height 5\' 11"  (1.803 m), weight 202 lb (91.6 kg), SpO2 91 %. Body mass index is 28.17 kg/m.  General: Cooperative, alert, well developed, in no acute distress. HEENT: Conjunctivae and lids unremarkable. Cardiovascular: Regular rhythm.  Lungs: Normal work of breathing. Neurologic: No focal deficits.   Lab Results  Component Value Date   CREATININE 0.82 12/29/2021   BUN 17 12/29/2021   NA 140 12/29/2021   K 4.7 12/29/2021   CL 101 12/29/2021   CO2 24 12/29/2021   Lab Results  Component Value Date   ALT 26 12/29/2021   AST 19 12/29/2021   ALKPHOS 62 12/29/2021   BILITOT 0.2 12/29/2021   Lab Results  Component Value Date   HGBA1C 7.2 (H) 06/04/2021   HGBA1C 7.1 (H) 02/18/2020   HGBA1C 7.0 (H) 02/26/2019   Lab Results  Component Value Date   INSULIN 22.7  12/29/2021   Lab Results  Component Value Date   TSH 1.420 12/29/2021   Lab Results  Component Value Date   CHOL 116 12/29/2021   HDL 48 12/29/2021   LDLCALC 52 12/29/2021   LDLDIRECT 61 07/27/2019   TRIG 79 12/29/2021   CHOLHDL 2.6 06/04/2021   Lab Results  Component Value Date   VD25OH 40.0 12/29/2021   Lab Results  Component Value Date   WBC 6.9 12/29/2021   HGB 14.7 12/29/2021   HCT 45.9 12/29/2021   MCV 83 12/29/2021   PLT 143  (L) 12/29/2021   No results found for: "IRON", "TIBC", "FERRITIN"  Attestation Statements:   Reviewed by clinician on day of visit: allergies, medications, problem list, medical history, surgical history, family history, social history, and previous encounter notes.  I, Fortino Sic, RMA am acting as transcriptionist for Reuben Likes, MD.  I have reviewed the above documentation for accuracy and completeness, and I agree with the above. - Reuben Likes, MD

## 2022-04-20 ENCOUNTER — Other Ambulatory Visit (HOSPITAL_COMMUNITY): Payer: Self-pay

## 2022-05-05 ENCOUNTER — Ambulatory Visit (INDEPENDENT_AMBULATORY_CARE_PROVIDER_SITE_OTHER): Payer: 59 | Admitting: Family Medicine

## 2022-05-05 ENCOUNTER — Encounter (INDEPENDENT_AMBULATORY_CARE_PROVIDER_SITE_OTHER): Payer: Self-pay | Admitting: Family Medicine

## 2022-05-05 VITALS — BP 127/65 | HR 72 | Temp 98.2°F | Ht 71.0 in | Wt 198.0 lb

## 2022-05-05 DIAGNOSIS — I152 Hypertension secondary to endocrine disorders: Secondary | ICD-10-CM

## 2022-05-05 DIAGNOSIS — E1159 Type 2 diabetes mellitus with other circulatory complications: Secondary | ICD-10-CM | POA: Diagnosis not present

## 2022-05-05 DIAGNOSIS — E1165 Type 2 diabetes mellitus with hyperglycemia: Secondary | ICD-10-CM | POA: Diagnosis not present

## 2022-05-05 DIAGNOSIS — Z6827 Body mass index (BMI) 27.0-27.9, adult: Secondary | ICD-10-CM | POA: Diagnosis not present

## 2022-05-05 DIAGNOSIS — E669 Obesity, unspecified: Secondary | ICD-10-CM | POA: Diagnosis not present

## 2022-05-05 DIAGNOSIS — Z7985 Long-term (current) use of injectable non-insulin antidiabetic drugs: Secondary | ICD-10-CM | POA: Diagnosis not present

## 2022-05-18 NOTE — Progress Notes (Signed)
Chief Complaint:   OBESITY Timothy May is here to discuss his progress with his obesity treatment plan along with follow-up of his obesity related diagnoses. Timothy May is on the Category 4 Plan and states he is following his eating plan approximately 85% of the time. Timothy May states he is walking 7-10,000 steps 6 times per week.  Today's visit was #: 6 Starting weight: 227 lbs Starting date: 12/29/2021 Today's weight: 198 lbs Today's date: 05/05/2022 Total lbs lost to date: 29 lbs Total lbs lost since last in-office visit: 6  Interim History: Jace has been working and trying to stick close to calories and protein of Cat 4. Working on getting everything in with all meals--breakfast is the hardest. Going out of town for a week this weekend.  Subjective:   1. Hypertension associated with diabetes [facility]) Armon is currently taking Amlodipine and Zestril. His blood pressure is well controlled. Denies chest pain, chest pressure and headache.  2. Type 2 diabetes mellitus with hyperglycemia, without long-term current use of insulin (HCC) Brandt's A1c at 6.2 with PCP on 04/08/22. His last insulin level was 22.7. He is on Ozempic.  Assessment/Plan:   1. Hypertension associated with diabetes (HCC) Continue taking current medications without any changes in doses or medications.  2. Type 2 diabetes mellitus with hyperglycemia, without long-term current use of insulin (HCC) Continue Ozempic. Will repeat labs in Feb 2024.  3. Obesity with current BMI of 27.7 Timothy May is currently in the action stage of change. As such, his goal is to continue with weight loss efforts. He has agreed to the Category 4 Plan.   Exercise goals: All adults should avoid inactivity. Some physical activity is better than none, and adults who participate in any amount of physical activity gain some health benefits.  Timothy May is to start resistance training in January to help maintain muscle mass.  Behavioral modification strategies:  increasing lean protein intake, meal planning and cooking strategies, keeping healthy foods in the home, travel eating strategies, holiday eating strategies , and planning for success.  Timothy May has agreed to follow-up with our clinic in 6 weeks. He was informed of the importance of frequent follow-up visits to maximize his success with intensive lifestyle modifications for his multiple health conditions.   Objective:   Blood pressure 127/65, pulse 72, temperature 98.2 F (36.8 C), height 5\' 11"  (1.803 m), weight 198 lb (89.8 kg), SpO2 97 %. Body mass index is 27.62 kg/m.  General: Cooperative, alert, well developed, in no acute distress. HEENT: Conjunctivae and lids unremarkable. Cardiovascular: Regular rhythm.  Lungs: Normal work of breathing. Neurologic: No focal deficits.   Lab Results  Component Value Date   CREATININE 0.82 12/29/2021   BUN 17 12/29/2021   NA 140 12/29/2021   K 4.7 12/29/2021   CL 101 12/29/2021   CO2 24 12/29/2021   Lab Results  Component Value Date   ALT 26 12/29/2021   AST 19 12/29/2021   ALKPHOS 62 12/29/2021   BILITOT 0.2 12/29/2021   Lab Results  Component Value Date   HGBA1C 7.2 (H) 06/04/2021   HGBA1C 7.1 (H) 02/18/2020   HGBA1C 7.0 (H) 02/26/2019   Lab Results  Component Value Date   INSULIN 22.7 12/29/2021   Lab Results  Component Value Date   TSH 1.420 12/29/2021   Lab Results  Component Value Date   CHOL 116 12/29/2021   HDL 48 12/29/2021   LDLCALC 52 12/29/2021   LDLDIRECT 61 07/27/2019   TRIG 79 12/29/2021  CHOLHDL 2.6 06/04/2021   Lab Results  Component Value Date   VD25OH 40.0 12/29/2021   Lab Results  Component Value Date   WBC 6.9 12/29/2021   HGB 14.7 12/29/2021   HCT 45.9 12/29/2021   MCV 83 12/29/2021   PLT 143 (L) 12/29/2021   No results found for: "IRON", "TIBC", "FERRITIN"  Attestation Statements:   Reviewed by clinician on day of visit: allergies, medications, problem list, medical history, surgical  history, family history, social history, and previous encounter notes.  I, Fortino Sic, RMA am acting as transcriptionist for Reuben Likes, MD.  I have reviewed the above documentation for accuracy and completeness, and I agree with the above. - Reuben Likes, MD

## 2022-06-28 ENCOUNTER — Other Ambulatory Visit: Payer: Self-pay | Admitting: Cardiology

## 2022-06-28 ENCOUNTER — Other Ambulatory Visit: Payer: Self-pay

## 2022-06-28 ENCOUNTER — Ambulatory Visit (INDEPENDENT_AMBULATORY_CARE_PROVIDER_SITE_OTHER): Payer: Commercial Managed Care - PPO | Admitting: Family Medicine

## 2022-06-28 ENCOUNTER — Encounter (INDEPENDENT_AMBULATORY_CARE_PROVIDER_SITE_OTHER): Payer: Self-pay | Admitting: Family Medicine

## 2022-06-28 ENCOUNTER — Other Ambulatory Visit (HOSPITAL_COMMUNITY): Payer: Self-pay

## 2022-06-28 VITALS — BP 128/66 | HR 55 | Temp 97.7°F | Ht 71.0 in | Wt 200.0 lb

## 2022-06-28 DIAGNOSIS — E1159 Type 2 diabetes mellitus with other circulatory complications: Secondary | ICD-10-CM

## 2022-06-28 DIAGNOSIS — E669 Obesity, unspecified: Secondary | ICD-10-CM | POA: Diagnosis not present

## 2022-06-28 DIAGNOSIS — Z7985 Long-term (current) use of injectable non-insulin antidiabetic drugs: Secondary | ICD-10-CM

## 2022-06-28 DIAGNOSIS — I152 Hypertension secondary to endocrine disorders: Secondary | ICD-10-CM | POA: Diagnosis not present

## 2022-06-28 DIAGNOSIS — Z6828 Body mass index (BMI) 28.0-28.9, adult: Secondary | ICD-10-CM

## 2022-06-28 DIAGNOSIS — E1165 Type 2 diabetes mellitus with hyperglycemia: Secondary | ICD-10-CM

## 2022-06-28 MED ORDER — SEMAGLUTIDE(0.25 OR 0.5MG/DOS) 2 MG/3ML ~~LOC~~ SOPN
0.5000 mg | PEN_INJECTOR | SUBCUTANEOUS | 0 refills | Status: DC
  Filled 2022-06-28: qty 9, 84d supply, fill #0

## 2022-06-28 MED FILL — Amlodipine Besylate Tab 10 MG (Base Equivalent): ORAL | 90 days supply | Qty: 90 | Fill #1 | Status: AC

## 2022-06-29 ENCOUNTER — Telehealth (INDEPENDENT_AMBULATORY_CARE_PROVIDER_SITE_OTHER): Payer: Self-pay

## 2022-06-29 ENCOUNTER — Other Ambulatory Visit (HOSPITAL_COMMUNITY): Payer: Self-pay

## 2022-06-29 MED ORDER — LISINOPRIL 40 MG PO TABS
40.0000 mg | ORAL_TABLET | Freq: Every day | ORAL | 0 refills | Status: DC
  Filled 2022-06-29: qty 90, 90d supply, fill #0

## 2022-06-29 NOTE — Telephone Encounter (Signed)
(  Key: [id number]) Ozempic (0.25 or 0.5 MG/DOSE) 2MG /3ML pen-injectors   Prior auth submitted thru covermymeds.

## 2022-06-30 ENCOUNTER — Encounter (INDEPENDENT_AMBULATORY_CARE_PROVIDER_SITE_OTHER): Payer: Self-pay

## 2022-06-30 NOTE — Telephone Encounter (Signed)
Message from insurance Plan: Member should be able to get the drug/product without a PA at this time. (Key: [id number]) Ozempic (0.25 or 0.5 MG/DOSE) 2MG /3ML pen-injectors.

## 2022-07-06 NOTE — Progress Notes (Signed)
Chief Complaint:   OBESITY Timothy May is here to discuss his progress with his obesity treatment plan along with follow-up of his obesity related diagnoses. Timothy May is on the Category 4 Plan and states he is following his eating plan approximately 50-55% of the time. Timothy May states he is walking 7,000 steps 5 times per week.  Today's visit was #: 7 Starting weight: 227 lbs Starting date: 12/29/2021 Today's weight: 200 lbs Today's date: 06/28/2022 Total lbs lost to date: 27 lbs Total lbs lost since last in-office visit: 0  Interim History: [name] went to [facility] with granddaughter for Thanksgiving and was very busy.  Stayed home for Christmas then went to [facility] for New Years.  Going to be home until next February.  Wants to get back on plan.  Subjective:   1. Type 2 diabetes mellitus with hyperglycemia, without long-term current use of insulin (HCC) Timothy May is on Ozempic 0.5 mg weekly.  Prior A1c was 7.2.  2. Hypertension associated with diabetes ([place]) Timothy May blood pressure well-controlled today.  Denies chest pain, chest pressure and headache.  He is on Norvasc, Coreg and Zestril.  Assessment/Plan:   1. Type 2 diabetes mellitus with hyperglycemia, without long-term current use of insulin (HCC) We will refill Ozempic 0.5 mg subcu once a week for 1 month with 0 refills.  -Refill Semaglutide,0.25 or 0.5MG /DOS, 2 MG/3ML SOPN; Inject 0.5 mg into the skin once a week.  Dispense: 9 mL; Refill: 0  2. Hypertension associated with diabetes ([place]) Continue current medications without any changes in dose.  3. Obesity with current BMI of 28.0 Timothy May is currently in the action stage of change. As such, his goal is to continue with weight loss efforts. He has agreed to the Category 4 Plan.   Exercise goals: All adults should avoid inactivity. Some physical activity is better than none, and adults who participate in any amount of physical activity gain some health benefits.  Behavioral modification  strategies: increasing lean protein intake, meal planning and cooking strategies, keeping healthy foods in the home, and planning for success.  Timothy May has agreed to follow-up with our clinic in 4 weeks. He was informed of the importance of frequent follow-up visits to maximize his success with intensive lifestyle modifications for his multiple health conditions.   Objective:   Blood pressure 128/66, pulse (!) 55, temperature 97.7 F (36.5 C), height 5\' 11"  (1.803 m), weight 200 lb (90.7 kg), SpO2 97 %. Body mass index is 27.89 kg/m.  General: Cooperative, alert, well developed, in no acute distress. HEENT: Conjunctivae and lids unremarkable. Cardiovascular: Regular rhythm.  Lungs: Normal work of breathing. Neurologic: No focal deficits.   Lab Results  Component Value Date   CREATININE 0.82 12/29/2021   BUN 17 12/29/2021   NA 140 12/29/2021   K 4.7 12/29/2021   CL 101 12/29/2021   CO2 24 12/29/2021   Lab Results  Component Value Date   ALT 26 12/29/2021   AST 19 12/29/2021   ALKPHOS 62 12/29/2021   BILITOT 0.2 12/29/2021   Lab Results  Component Value Date   HGBA1C 7.2 (H) 06/04/2021   HGBA1C 7.1 (H) 02/18/2020   HGBA1C 7.0 (H) 02/26/2019   Lab Results  Component Value Date   INSULIN 22.7 12/29/2021   Lab Results  Component Value Date   TSH 1.420 12/29/2021   Lab Results  Component Value Date   CHOL 116 12/29/2021   HDL 48 12/29/2021   LDLCALC 52 12/29/2021   LDLDIRECT 61 07/27/2019  TRIG 79 12/29/2021   CHOLHDL 2.6 06/04/2021   Lab Results  Component Value Date   VD25OH 40.0 12/29/2021   Lab Results  Component Value Date   WBC 6.9 12/29/2021   HGB 14.7 12/29/2021   HCT 45.9 12/29/2021   MCV 83 12/29/2021   PLT 143 (L) 12/29/2021   No results found for: "IRON", "TIBC", "FERRITIN"  Attestation Statements:   Reviewed by clinician on day of visit: allergies, medications, problem list, medical history, surgical history, family history, social  history, and previous encounter notes.  I, Elnora Morrison, RMA am acting as transcriptionist for Coralie Common, MD.  I have reviewed the above documentation for accuracy and completeness, and I agree with the above. - Coralie Common, MD

## 2022-07-12 ENCOUNTER — Other Ambulatory Visit (HOSPITAL_COMMUNITY): Payer: Self-pay

## 2022-07-26 ENCOUNTER — Ambulatory Visit (INDEPENDENT_AMBULATORY_CARE_PROVIDER_SITE_OTHER): Payer: Commercial Managed Care - PPO | Admitting: Family Medicine

## 2022-09-02 ENCOUNTER — Other Ambulatory Visit (HOSPITAL_COMMUNITY): Payer: Self-pay

## 2022-09-02 DIAGNOSIS — I2510 Atherosclerotic heart disease of native coronary artery without angina pectoris: Secondary | ICD-10-CM | POA: Diagnosis not present

## 2022-09-02 DIAGNOSIS — E785 Hyperlipidemia, unspecified: Secondary | ICD-10-CM | POA: Diagnosis not present

## 2022-09-02 DIAGNOSIS — I10 Essential (primary) hypertension: Secondary | ICD-10-CM | POA: Diagnosis not present

## 2022-09-02 DIAGNOSIS — E1159 Type 2 diabetes mellitus with other circulatory complications: Secondary | ICD-10-CM | POA: Diagnosis not present

## 2022-09-02 DIAGNOSIS — N529 Male erectile dysfunction, unspecified: Secondary | ICD-10-CM | POA: Diagnosis not present

## 2022-09-02 DIAGNOSIS — F419 Anxiety disorder, unspecified: Secondary | ICD-10-CM | POA: Diagnosis not present

## 2022-09-02 MED ORDER — EZETIMIBE 10 MG PO TABS
10.0000 mg | ORAL_TABLET | Freq: Every day | ORAL | 1 refills | Status: DC
  Filled 2022-09-02: qty 90, 90d supply, fill #0

## 2022-09-02 MED ORDER — AMLODIPINE BESYLATE 10 MG PO TABS
10.0000 mg | ORAL_TABLET | Freq: Every day | ORAL | 1 refills | Status: DC
  Filled 2022-09-02: qty 90, 90d supply, fill #0
  Filled 2023-02-08: qty 90, 90d supply, fill #1

## 2022-09-02 MED ORDER — CLONAZEPAM 1 MG PO TABS
0.5000 mg | ORAL_TABLET | Freq: Two times a day (BID) | ORAL | 5 refills | Status: DC | PRN
  Filled 2022-09-02: qty 60, 30d supply, fill #0

## 2022-09-02 MED ORDER — OZEMPIC (0.25 OR 0.5 MG/DOSE) 2 MG/3ML ~~LOC~~ SOPN
0.5000 mg | PEN_INJECTOR | SUBCUTANEOUS | 5 refills | Status: DC
  Filled 2022-09-02: qty 3, 28d supply, fill #0
  Filled 2022-10-22: qty 3, 28d supply, fill #1
  Filled 2022-11-16 (×2): qty 3, 28d supply, fill #2
  Filled 2022-11-17: qty 9, 84d supply, fill #2
  Filled 2023-03-08 – 2023-03-15 (×2): qty 3, 28d supply, fill #3

## 2022-09-02 MED ORDER — ROSUVASTATIN CALCIUM 40 MG PO TABS
40.0000 mg | ORAL_TABLET | Freq: Every day | ORAL | 1 refills | Status: DC
  Filled 2022-09-02: qty 90, 90d supply, fill #0

## 2022-09-02 MED ORDER — TADALAFIL 20 MG PO TABS
10.0000 mg | ORAL_TABLET | Freq: Every day | ORAL | 2 refills | Status: DC | PRN
  Filled 2022-09-02: qty 18, 18d supply, fill #0

## 2022-09-02 MED ORDER — CARVEDILOL 6.25 MG PO TABS
6.2500 mg | ORAL_TABLET | Freq: Two times a day (BID) | ORAL | 1 refills | Status: DC
  Filled 2022-09-02: qty 180, 90d supply, fill #0
  Filled 2023-02-08: qty 180, 90d supply, fill #1

## 2022-09-02 MED ORDER — LISINOPRIL 10 MG PO TABS
10.0000 mg | ORAL_TABLET | Freq: Every day | ORAL | 1 refills | Status: DC
  Filled 2022-09-02: qty 90, 90d supply, fill #0

## 2022-09-14 ENCOUNTER — Other Ambulatory Visit (HOSPITAL_COMMUNITY): Payer: Self-pay

## 2022-09-29 ENCOUNTER — Other Ambulatory Visit (HOSPITAL_COMMUNITY): Payer: Self-pay

## 2022-10-22 ENCOUNTER — Other Ambulatory Visit (HOSPITAL_COMMUNITY): Payer: Self-pay

## 2022-10-27 ENCOUNTER — Other Ambulatory Visit (HOSPITAL_COMMUNITY): Payer: Self-pay

## 2022-11-16 ENCOUNTER — Other Ambulatory Visit (HOSPITAL_COMMUNITY): Payer: Self-pay

## 2022-11-17 ENCOUNTER — Other Ambulatory Visit (HOSPITAL_COMMUNITY): Payer: Self-pay

## 2023-02-08 ENCOUNTER — Other Ambulatory Visit: Payer: Self-pay | Admitting: Cardiology

## 2023-02-08 ENCOUNTER — Other Ambulatory Visit (HOSPITAL_COMMUNITY): Payer: Self-pay

## 2023-02-09 ENCOUNTER — Other Ambulatory Visit: Payer: Self-pay

## 2023-02-09 ENCOUNTER — Other Ambulatory Visit (HOSPITAL_COMMUNITY): Payer: Self-pay

## 2023-02-09 MED ORDER — LISINOPRIL 40 MG PO TABS
40.0000 mg | ORAL_TABLET | Freq: Every day | ORAL | 0 refills | Status: DC
  Filled 2023-02-09: qty 90, 90d supply, fill #0

## 2023-02-22 ENCOUNTER — Other Ambulatory Visit (HOSPITAL_COMMUNITY): Payer: Self-pay

## 2023-03-08 ENCOUNTER — Other Ambulatory Visit (HOSPITAL_COMMUNITY): Payer: Self-pay

## 2023-03-08 MED ORDER — OZEMPIC (0.25 OR 0.5 MG/DOSE) 2 MG/3ML ~~LOC~~ SOPN
0.5000 mg | PEN_INJECTOR | SUBCUTANEOUS | 5 refills | Status: DC
  Filled 2023-03-08: qty 3, 28d supply, fill #0
  Filled 2023-03-15: qty 6, 56d supply, fill #0
  Filled 2023-03-15: qty 3, 28d supply, fill #0
  Filled 2023-06-06: qty 9, 84d supply, fill #1
  Filled 2023-08-30: qty 9, 84d supply, fill #2
  Filled 2023-12-16: qty 3, 28d supply, fill #3
  Filled 2024-01-17: qty 9, 84d supply, fill #4

## 2023-03-08 MED ORDER — CARVEDILOL 6.25 MG PO TABS
6.2500 mg | ORAL_TABLET | Freq: Two times a day (BID) | ORAL | 1 refills | Status: AC
  Filled 2023-03-08 – 2023-08-30 (×2): qty 180, 90d supply, fill #0
  Filled 2024-01-17: qty 180, 90d supply, fill #1

## 2023-03-08 MED ORDER — ROSUVASTATIN CALCIUM 40 MG PO TABS
40.0000 mg | ORAL_TABLET | Freq: Every day | ORAL | 1 refills | Status: DC
Start: 2023-03-08 — End: 2024-03-30
  Filled 2023-03-08: qty 90, 90d supply, fill #0
  Filled 2024-01-17: qty 90, 90d supply, fill #1

## 2023-03-08 MED ORDER — EZETIMIBE 10 MG PO TABS
10.0000 mg | ORAL_TABLET | Freq: Every day | ORAL | 1 refills | Status: AC
  Filled 2023-03-08: qty 90, 90d supply, fill #0

## 2023-03-08 MED ORDER — CLONAZEPAM 1 MG PO TABS
0.5000 mg | ORAL_TABLET | Freq: Two times a day (BID) | ORAL | 5 refills | Status: AC | PRN
  Filled 2023-03-08: qty 60, 30d supply, fill #0

## 2023-03-08 MED ORDER — TADALAFIL 20 MG PO TABS
10.0000 mg | ORAL_TABLET | Freq: Every day | ORAL | 2 refills | Status: AC | PRN
Start: 2023-03-08 — End: ?
  Filled 2023-03-08: qty 18, 90d supply, fill #0
  Filled 2023-08-30: qty 18, 90d supply, fill #1

## 2023-03-08 MED ORDER — LISINOPRIL 10 MG PO TABS
10.0000 mg | ORAL_TABLET | Freq: Every day | ORAL | 1 refills | Status: AC
  Filled 2023-03-08 – 2023-06-06 (×2): qty 90, 90d supply, fill #0
  Filled 2023-08-30: qty 90, 90d supply, fill #1

## 2023-03-08 MED ORDER — AMLODIPINE BESYLATE 10 MG PO TABS
10.0000 mg | ORAL_TABLET | Freq: Every day | ORAL | 1 refills | Status: DC
  Filled 2023-03-08 – 2023-12-16 (×3): qty 90, 90d supply, fill #0

## 2023-03-09 ENCOUNTER — Other Ambulatory Visit (HOSPITAL_COMMUNITY): Payer: Self-pay

## 2023-03-15 ENCOUNTER — Other Ambulatory Visit: Payer: Self-pay

## 2023-03-15 ENCOUNTER — Other Ambulatory Visit (HOSPITAL_COMMUNITY): Payer: Self-pay

## 2023-03-21 ENCOUNTER — Other Ambulatory Visit (HOSPITAL_COMMUNITY): Payer: Self-pay

## 2023-06-03 NOTE — Progress Notes (Signed)
Cardiology Office Note   Date:  06/10/2023   ID:  Timothy May, DOB [birth year], MRN [id number]  PCP:  Joycelyn Rua, MD  Cardiologist:  Kasch Borquez [place], MD   Chief Complaint  Patient presents with   Coronary Artery Disease      History of Present Illness: Timothy May is a 62 y.o. male who is seen for follow up CAD. He presented with acute inferior STEMI on 02/26/19.  He received a DES x1 to his RCA.  He had proximal to mid LAD lesion that was 20%, second marginal lesion 40%, distal circumflex 40% and 40% stenosed sidebranch in LPA V.  His PMH also includes essential hypertension, type 2 diabetes, generalized anxiety disorder, hyperlipidemia, tobacco abuse, dizziness, and depression.   On follow up today he is doing well. He has not smoked since September 2020. He has been eating healthy.  He denies any chest pain or dyspnea and energy level is good.  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    Past Medical History:  Diagnosis Date   Anxiety    Back pain    Bilateral swelling of feet    CAD (coronary artery disease)    a. 02/2019 STEMI s/p DES to RCA   DDD (degenerative disc disease), lumbar    followed by Dr Newell Coral   Diabetes [facility])    Gout    History of heart attack    History of STEMI    02/26/19 PCI/DES to the RCA   Hypercholesteremia    Hyperlipidemia    Hypertension    Prediabetes    Renal calculus or stone    Tobacco abuse    Type 2 diabetes mellitus with complication, without long-term current use of insulin (HCC) 02/27/2019    Past Surgical History:  Procedure Laterality Date   APPENDECTOMY     20 years ago   CORONARY STENT INTERVENTION N/A 02/26/2019   Procedure: CORONARY STENT INTERVENTION;  Surgeon: [place], Konnar Ben M, MD;  Location: [facility] INVASIVE CV LAB;  Service: Cardiovascular;  Laterality: N/A;   CORONARY/GRAFT ACUTE MI REVASCULARIZATION N/A 02/26/2019   Procedure: Coronary/Graft Acute MI Revascularization;  Surgeon:  [place], Lashina Milles M, MD;  Location: [facility] INVASIVE CV LAB;  Service: Cardiovascular;  Laterality: N/A;   LEFT HEART CATH AND CORONARY ANGIOGRAPHY N/A 02/26/2019   Procedure: LEFT HEART CATH AND CORONARY ANGIOGRAPHY;  Surgeon: [place], Donnavin Vandenbrink M, MD;  Location: [facility] INVASIVE CV LAB;  Service: Cardiovascular;  Laterality: N/A;     Current Outpatient Medications  Medication Sig Dispense Refill   amLODipine (NORVASC) 10 MG tablet Take 1 tablet (10 mg total) by mouth daily. 90 tablet 1   aspirin (ASPIRIN LOW DOSE) 81 MG chewable tablet Chew 1 tablet (81 mg total) by mouth daily. 90 tablet 3   blood glucose meter kit and supplies Dispense based on patient and insurance preference. Use up to four times daily as directed. (FOR ICD-10 E10.9, E11.9). 1 each 0   carvedilol (COREG) 6.25 MG tablet Take 1 tablet (6.25 mg total) by mouth 2 (two) times daily with food 180 tablet 1   clonazePAM (KLONOPIN) 1 MG tablet Take 0.5-1 tablets (0.5-1 mg total) by mouth 2 (two) times daily or as needed. 60 tablet 5   Continuous Blood Gluc Sensor (DEXCOM G6 SENSOR) MISC Use as directed every 10 days 3 each 2   Continuous Blood Gluc Transmit (DEXCOM G6 TRANSMITTER) MISC Use as directed 1 each 1   ezetimibe (ZETIA)  10 MG tablet Take 1 tablet (10 mg total) by mouth daily. 90 tablet 1   glucose blood test strip Use 1 to 2 times daily 200 each 3   Insulin Pen Needle (BD PEN NEEDLE NANO 2ND GEN) 32G X 4 MM MISC Use 1 pen needle 2 times daily 100 each 0   lisinopril (ZESTRIL) 10 MG tablet Take 1 tablet (10 mg total) by mouth daily. 90 tablet 1   nitroGLYCERIN (NITROSTAT) 0.4 MG SL tablet Place 1 tablet (0.4 mg total) under the tongue every 5 (five) minutes x 3 doses as needed for chest pain. 25 tablet 2   rosuvastatin (CRESTOR) 40 MG tablet Take 1 tablet (40 mg total) by mouth daily. 90 tablet 1   rosuvastatin (CRESTOR) 40 MG tablet Take 1 tablet (40 mg total) by mouth daily. 90 tablet 1   Semaglutide,0.25 or 0.5MG /DOS, (OZEMPIC, 0.25 OR 0.5  MG/DOSE,) 2 MG/3ML SOPN Inject 0.5 mg into the skin every 7 (seven) days. 9 mL 5   tadalafil (CIALIS) 20 MG tablet Take 1/2-1 tablet (10-20 mg total) by mouth daily as needed. 18 tablet 2   No current facility-administered medications for this visit.    Allergies:   Patient has no known allergies.    Social History:  The patient  reports that he quit smoking about 4 years ago. His smoking use included cigarettes. He started smoking about 24 years ago. He has a 20 pack-year smoking history. He has never used smokeless tobacco. He reports current alcohol use. He reports that he does not use drugs.   Family History:  The patient's family history includes Anxiety disorder in his mother; Cancer in his mother and sister; Heart attack in his father; Heart disease in his father; Hyperlipidemia in his father; Hypertension in his father; Obesity in his father; Stroke in his father.    ROS:  Please see the history of present illness.   Otherwise, review of systems are positive for none.   All other systems are reviewed and negative.    PHYSICAL EXAM: VS:  BP 137/80 (BP Location: Left Arm, Patient Position: Sitting, Cuff Size: Normal)   Pulse (!) 50   Ht 5\' 11"  (1.803 m)   Wt 211 lb (95.7 kg)   SpO2 99%   BMI 29.43 kg/m  , BMI Body mass index is 29.43 kg/m. GEN: Well nourished, overweight, in no acute distress  HEENT: normal  Neck: no JVD, carotid bruits, or masses Cardiac: RRR; no murmurs, rubs, or gallops,no edema  Respiratory:  clear to auscultation bilaterally, normal work of breathing GI: soft, nontender, nondistended, + BS MS: no deformity or atrophy  Skin: warm and dry, no rash Neuro:  Strength and sensation are intact Psych: euthymic mood, full affect   EKG Interpretation Date/Time:  Friday June 10 2023 08:22:15 EST Ventricular Rate:  50 PR Interval:  182 QRS Duration:  82 QT Interval:  446 QTC Calculation: 406 R Axis:   67  Text Interpretation: Sinus bradycardia When  compared with ECG of 27-Feb-2019 06:55, Nonspecific T wave abnormality no longer evident in Anterior leads QT has shortened Confirmed by [place], Braeleigh Pyper [phone]) on 06/10/2023 8:29:00 AM      Recent Labs: No results found for requested labs within last 365 days.    Lipid Panel    Component Value Date/Time   CHOL 116 12/29/2021 1112   TRIG 79 12/29/2021 1112   HDL 48 12/29/2021 1112   CHOLHDL 2.6 06/04/2021 0830   CHOLHDL 7.3 02/25/2019 2337  VLDL UNABLE TO CALCULATE IF TRIGLYCERIDE OVER 400 mg/dL 84/13/2440 1027   LDLCALC 52 12/29/2021 1112   LDLDIRECT 61 07/27/2019 1009   LDLDIRECT 146.7 (H) 02/25/2019 2337    Dated 04/26/19: cholesterol 155, triglycerides 135, HDL 41, LDL 94.  Dated 06/06/19: A1c 6.9%. LFTs normal.,  Dated 09/02/22: cholesterol 116, triglycerides 59, HDL 45, LDL 57. LFTs normal Dated 03/08/23: A1c 6.3%  Wt Readings from Last 3 Encounters:  06/10/23 211 lb (95.7 kg)  06/28/22 200 lb (90.7 kg)  05/05/22 198 lb (89.8 kg)      Other studies Reviewed: Additional studies/ records that were reviewed today include:  Echocardiogram 02/26/2019 IMPRESSIONS    1. The left ventricle has normal systolic function, with an ejection fraction of 55-60%. The cavity size was normal. There is moderately increased left ventricular wall thickness. Left ventricular diastolic Doppler parameters are consistent with  pseudonormalization.  2. There is mild mitral annular calcification present.  3. The aortic valve is tricuspid. Mild sclerosis of the aortic valve.  4. The aorta is normal unless otherwise noted.  5. The inferior vena cava was dilated in size with >50% respiratory variability  Cardiac cath 02/26/19: Procedures  Coronary/Graft Acute MI Revascularization  CORONARY STENT INTERVENTION  LEFT HEART CATH AND CORONARY ANGIOGRAPHY  Conclusion    Prox LAD to Mid LAD lesion is 20% stenosed. 2nd Mrg lesion is 40% stenosed. Dist Cx lesion is 40% stenosed with 40% stenosed side  branch in LPAV. Prox RCA lesion is 100% stenosed. Post intervention, there is a 0% residual stenosis. A drug-eluting stent was successfully placed using a STENT SYNERGY DES 2.5X16. The left ventricular systolic function is normal. LV end diastolic pressure is normal. The left ventricular ejection fraction is 55-65% by visual estimate.   1. 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 Normal LV function 3. Normal LVEDP 4. Successful PCI of the RCA with DES x 1   Plan: DAPT for one year. Patient is a candidate for fast track DC.         ASSESSMENT AND PLAN:  1. CAD s/p inferior STEMI on 02/26/19. DES of RCA x 1. No other obstructive disease. Normal LV function. Continue ASA and statin therapy  2. HTN- well controlled. Continue amlodipine, lisinopril, Coreg.   3. Hyperlipidemia- on high dose statin and Zetia. LDL is at goal with LDL 57  4. DM type 2. On metformin/Actos. Ast A1c 6.3%. encourage with his dietary changes and weight loss.   Current medicines are reviewed at length with the patient today.  The patient does not have concerns regarding medicines.  The following changes have been made:  See above  Labs/ tests ordered today include:   Orders Placed This Encounter  Procedures   EKG 12-Lead     Disposition:   FU with me in 12 months  Signed, Georgie Eduardo [place], MD  06/10/2023 8:37 AM    [facility] [address] [place], [place], [address] Phone [phone], Fax [phone]

## 2023-06-06 ENCOUNTER — Other Ambulatory Visit (HOSPITAL_COMMUNITY): Payer: Self-pay

## 2023-06-07 ENCOUNTER — Other Ambulatory Visit (HOSPITAL_COMMUNITY): Payer: Self-pay

## 2023-06-10 ENCOUNTER — Ambulatory Visit: Payer: 59 | Attending: Cardiology | Admitting: Cardiology

## 2023-06-10 ENCOUNTER — Encounter: Payer: Self-pay | Admitting: Cardiology

## 2023-06-10 VITALS — BP 137/80 | HR 50 | Ht 71.0 in | Wt 211.0 lb

## 2023-06-10 DIAGNOSIS — I10 Essential (primary) hypertension: Secondary | ICD-10-CM | POA: Diagnosis not present

## 2023-06-10 DIAGNOSIS — E7800 Pure hypercholesterolemia, unspecified: Secondary | ICD-10-CM | POA: Diagnosis not present

## 2023-06-10 DIAGNOSIS — I2510 Atherosclerotic heart disease of native coronary artery without angina pectoris: Secondary | ICD-10-CM

## 2023-06-10 DIAGNOSIS — E118 Type 2 diabetes mellitus with unspecified complications: Secondary | ICD-10-CM

## 2023-06-10 NOTE — Patient Instructions (Signed)
 Medication Instructions:  Continue same medications *If you need a refill on your cardiac medications before your next appointment, please call your pharmacy*   Lab Work: None ordered   Testing/Procedures: None ordered   Follow-Up: At [facility], you and your health needs are our priority.  As part of our continuing mission to provide you with exceptional heart care, we have created designated Provider Care Teams.  These Care Teams include your primary Cardiologist (physician) and Advanced Practice Providers (APPs -  Physician Assistants and Nurse Practitioners) who all work together to provide you with the care you need, when you need it.  We recommend signing up for the patient portal called "MyChart".  Sign up information is provided on this After Visit Summary.  MyChart is used to connect with patients for Virtual Visits (Telemedicine).  Patients are able to view lab/test results, encounter notes, upcoming appointments, etc.  Non-urgent messages can be sent to your provider as well.   To learn more about what you can do with MyChart, go to [email or web address].    Your next appointment:  1 year   Call in August to schedule Dec appointment    Provider:  Dr.Jordan

## 2023-08-30 ENCOUNTER — Other Ambulatory Visit (HOSPITAL_COMMUNITY): Payer: Self-pay

## 2023-09-15 ENCOUNTER — Other Ambulatory Visit (HOSPITAL_COMMUNITY): Payer: Self-pay

## 2023-09-15 ENCOUNTER — Other Ambulatory Visit: Payer: Self-pay

## 2023-09-15 MED ORDER — LISINOPRIL 20 MG PO TABS
20.0000 mg | ORAL_TABLET | Freq: Every day | ORAL | 3 refills | Status: AC
Start: 2023-09-15 — End: ?
  Filled 2023-09-15 – 2023-09-27 (×3): qty 90, 90d supply, fill #0

## 2023-09-27 ENCOUNTER — Other Ambulatory Visit (HOSPITAL_COMMUNITY): Payer: Self-pay

## 2023-11-07 ENCOUNTER — Other Ambulatory Visit (HOSPITAL_COMMUNITY): Payer: Self-pay

## 2023-11-17 ENCOUNTER — Other Ambulatory Visit (HOSPITAL_COMMUNITY): Payer: Self-pay

## 2023-12-16 ENCOUNTER — Other Ambulatory Visit (HOSPITAL_COMMUNITY): Payer: Self-pay

## 2023-12-19 ENCOUNTER — Other Ambulatory Visit (HOSPITAL_COMMUNITY): Payer: Self-pay

## 2024-01-17 ENCOUNTER — Other Ambulatory Visit (HOSPITAL_COMMUNITY): Payer: Self-pay

## 2024-03-30 ENCOUNTER — Other Ambulatory Visit (HOSPITAL_COMMUNITY): Payer: Self-pay

## 2024-03-30 MED ORDER — EZETIMIBE 10 MG PO TABS
10.0000 mg | ORAL_TABLET | Freq: Every day | ORAL | 1 refills | Status: AC
  Filled 2024-03-30: qty 30, 30d supply, fill #0

## 2024-03-30 MED ORDER — OZEMPIC (0.25 OR 0.5 MG/DOSE) 2 MG/3ML ~~LOC~~ SOPN
0.5000 mg | PEN_INJECTOR | SUBCUTANEOUS | 0 refills | Status: AC
  Filled 2024-03-30: qty 6, 56d supply, fill #0

## 2024-03-30 MED ORDER — TADALAFIL 20 MG PO TABS
10.0000 mg | ORAL_TABLET | ORAL | 1 refills | Status: AC | PRN
  Filled 2024-03-30: qty 10, 20d supply, fill #0
  Filled 2024-06-18: qty 10, 20d supply, fill #1

## 2024-03-30 MED ORDER — ROSUVASTATIN CALCIUM 40 MG PO TABS
40.0000 mg | ORAL_TABLET | Freq: Every day | ORAL | 1 refills | Status: AC
  Filled 2024-03-30: qty 30, 30d supply, fill #0

## 2024-04-03 ENCOUNTER — Other Ambulatory Visit (HOSPITAL_COMMUNITY): Payer: Self-pay

## 2024-04-06 ENCOUNTER — Other Ambulatory Visit (HOSPITAL_COMMUNITY): Payer: Self-pay

## 2024-04-18 ENCOUNTER — Other Ambulatory Visit: Payer: Self-pay

## 2024-04-18 ENCOUNTER — Other Ambulatory Visit (HOSPITAL_COMMUNITY): Payer: Self-pay

## 2024-04-18 MED ORDER — OZEMPIC (1 MG/DOSE) 4 MG/3ML ~~LOC~~ SOPN
1.0000 mg | PEN_INJECTOR | SUBCUTANEOUS | 2 refills | Status: AC
  Filled 2024-04-18 – 2024-05-23 (×2): qty 3, 28d supply, fill #0
  Filled 2024-06-18: qty 3, 28d supply, fill #1
  Filled 2024-07-04 – 2024-07-09 (×2): qty 3, 28d supply, fill #2

## 2024-04-18 MED ORDER — LISINOPRIL 20 MG PO TABS
40.0000 mg | ORAL_TABLET | Freq: Every day | ORAL | 3 refills | Status: AC
  Filled 2024-04-18 – 2024-05-23 (×2): qty 180, 90d supply, fill #0

## 2024-04-27 ENCOUNTER — Other Ambulatory Visit (HOSPITAL_COMMUNITY): Payer: Self-pay

## 2024-05-23 ENCOUNTER — Other Ambulatory Visit: Payer: Self-pay

## 2024-05-23 ENCOUNTER — Other Ambulatory Visit (HOSPITAL_COMMUNITY): Payer: Self-pay

## 2024-06-18 ENCOUNTER — Other Ambulatory Visit: Payer: Self-pay

## 2024-06-18 ENCOUNTER — Other Ambulatory Visit (HOSPITAL_COMMUNITY): Payer: Self-pay

## 2024-06-19 ENCOUNTER — Other Ambulatory Visit (HOSPITAL_COMMUNITY): Payer: Self-pay

## 2024-06-19 MED ORDER — AMLODIPINE BESYLATE 10 MG PO TABS
10.0000 mg | ORAL_TABLET | Freq: Every day | ORAL | 1 refills | Status: AC
  Filled 2024-07-04: qty 90, 90d supply, fill #0

## 2024-07-04 ENCOUNTER — Other Ambulatory Visit (HOSPITAL_COMMUNITY): Payer: Self-pay

## 2024-07-18 ENCOUNTER — Other Ambulatory Visit: Payer: Self-pay

## 2024-07-18 ENCOUNTER — Other Ambulatory Visit (HOSPITAL_COMMUNITY): Payer: Self-pay

## 2024-07-18 MED ORDER — CARVEDILOL 6.25 MG PO TABS
6.2500 mg | ORAL_TABLET | Freq: Two times a day (BID) | ORAL | 1 refills | Status: AC
  Filled 2024-07-18: qty 180, 90d supply, fill #0

## 2024-07-18 MED ORDER — CARVEDILOL 6.25 MG PO TABS: 6.2500 mg | ORAL_TABLET | Freq: Two times a day (BID) | ORAL | 1 refills | Status: AC

## 2024-07-25 ENCOUNTER — Other Ambulatory Visit: Payer: Self-pay
